# Patient Record
Sex: Female | Born: 1975 | Race: Black or African American | Hispanic: No | Marital: Single | Smoking: Current some day smoker
Health system: Southern US, Community
[De-identification: ages and names within clinical notes are randomized; demographics above are authoritative.]

## PROBLEM LIST (undated history)

## (undated) DIAGNOSIS — R42 Dizziness and giddiness: Secondary | ICD-10-CM

## (undated) DIAGNOSIS — K219 Gastro-esophageal reflux disease without esophagitis: Secondary | ICD-10-CM

## (undated) DIAGNOSIS — G5603 Carpal tunnel syndrome, bilateral upper limbs: Secondary | ICD-10-CM

## (undated) DIAGNOSIS — R7303 Prediabetes: Secondary | ICD-10-CM

## (undated) DIAGNOSIS — M6530 Trigger finger, unspecified finger: Secondary | ICD-10-CM

## (undated) DIAGNOSIS — G43019 Migraine without aura, intractable, without status migrainosus: Secondary | ICD-10-CM

## (undated) HISTORY — DX: Prediabetes: R73.03

## (undated) HISTORY — PX: BREAST REDUCTION SURGERY: SHX8

## (undated) HISTORY — DX: Gastro-esophageal reflux disease without esophagitis: K21.9

## (undated) HISTORY — DX: Migraine without aura, intractable, without status migrainosus: G43.019

## (undated) HISTORY — DX: Carpal tunnel syndrome, bilateral upper limbs: G56.03

---

## 1998-02-15 ENCOUNTER — Emergency Department (HOSPITAL_COMMUNITY): Admission: EM | Admit: 1998-02-15 | Discharge: 1998-02-15 | Payer: Self-pay | Admitting: Emergency Medicine

## 1998-07-16 ENCOUNTER — Emergency Department (HOSPITAL_COMMUNITY): Admission: EM | Admit: 1998-07-16 | Discharge: 1998-07-16 | Payer: Self-pay | Admitting: Emergency Medicine

## 1998-07-17 ENCOUNTER — Encounter: Payer: Self-pay | Admitting: Obstetrics and Gynecology

## 1998-07-17 ENCOUNTER — Ambulatory Visit (HOSPITAL_COMMUNITY): Admission: RE | Admit: 1998-07-17 | Discharge: 1998-07-17 | Payer: Self-pay | Admitting: Obstetrics and Gynecology

## 1999-07-16 ENCOUNTER — Emergency Department (HOSPITAL_COMMUNITY): Admission: EM | Admit: 1999-07-16 | Discharge: 1999-07-16 | Payer: Self-pay | Admitting: *Deleted

## 1999-11-27 ENCOUNTER — Observation Stay (HOSPITAL_COMMUNITY): Admission: AD | Admit: 1999-11-27 | Discharge: 1999-11-28 | Payer: Self-pay | Admitting: *Deleted

## 1999-11-28 ENCOUNTER — Encounter: Payer: Self-pay | Admitting: Obstetrics and Gynecology

## 2000-01-19 ENCOUNTER — Encounter (INDEPENDENT_AMBULATORY_CARE_PROVIDER_SITE_OTHER): Payer: Self-pay

## 2000-01-19 ENCOUNTER — Inpatient Hospital Stay (HOSPITAL_COMMUNITY): Admission: AD | Admit: 2000-01-19 | Discharge: 2000-01-19 | Payer: Self-pay | Admitting: *Deleted

## 2000-01-19 ENCOUNTER — Inpatient Hospital Stay (HOSPITAL_COMMUNITY): Admission: AD | Admit: 2000-01-19 | Discharge: 2000-01-22 | Payer: Self-pay | Admitting: *Deleted

## 2001-04-15 ENCOUNTER — Encounter: Payer: Self-pay | Admitting: Emergency Medicine

## 2001-04-15 ENCOUNTER — Emergency Department (HOSPITAL_COMMUNITY): Admission: EM | Admit: 2001-04-15 | Discharge: 2001-04-15 | Payer: Self-pay | Admitting: Emergency Medicine

## 2001-12-14 ENCOUNTER — Other Ambulatory Visit: Admission: RE | Admit: 2001-12-14 | Discharge: 2001-12-14 | Payer: Self-pay | Admitting: Obstetrics & Gynecology

## 2001-12-29 ENCOUNTER — Ambulatory Visit (HOSPITAL_COMMUNITY): Admission: RE | Admit: 2001-12-29 | Discharge: 2001-12-29 | Payer: Self-pay | Admitting: Obstetrics & Gynecology

## 2001-12-29 ENCOUNTER — Encounter: Payer: Self-pay | Admitting: Obstetrics & Gynecology

## 2002-02-02 ENCOUNTER — Other Ambulatory Visit: Admission: RE | Admit: 2002-02-02 | Discharge: 2002-02-02 | Payer: Self-pay | Admitting: Obstetrics & Gynecology

## 2002-03-02 ENCOUNTER — Encounter: Payer: Self-pay | Admitting: Obstetrics & Gynecology

## 2002-03-02 ENCOUNTER — Ambulatory Visit (HOSPITAL_COMMUNITY): Admission: RE | Admit: 2002-03-02 | Discharge: 2002-03-02 | Payer: Self-pay | Admitting: Obstetrics & Gynecology

## 2002-04-14 ENCOUNTER — Emergency Department (HOSPITAL_COMMUNITY): Admission: EM | Admit: 2002-04-14 | Discharge: 2002-04-14 | Payer: Self-pay | Admitting: Emergency Medicine

## 2002-05-30 ENCOUNTER — Ambulatory Visit (HOSPITAL_COMMUNITY): Admission: RE | Admit: 2002-05-30 | Discharge: 2002-05-30 | Payer: Self-pay | Admitting: *Deleted

## 2002-05-30 ENCOUNTER — Encounter: Payer: Self-pay | Admitting: Obstetrics & Gynecology

## 2002-06-28 ENCOUNTER — Inpatient Hospital Stay (HOSPITAL_COMMUNITY): Admission: AD | Admit: 2002-06-28 | Discharge: 2002-06-28 | Payer: Self-pay | Admitting: Family Medicine

## 2002-06-30 ENCOUNTER — Inpatient Hospital Stay (HOSPITAL_COMMUNITY): Admission: AD | Admit: 2002-06-30 | Discharge: 2002-07-03 | Payer: Self-pay | Admitting: Obstetrics

## 2002-12-22 ENCOUNTER — Emergency Department (HOSPITAL_COMMUNITY): Admission: EM | Admit: 2002-12-22 | Discharge: 2002-12-22 | Payer: Self-pay | Admitting: Emergency Medicine

## 2003-03-27 ENCOUNTER — Emergency Department (HOSPITAL_COMMUNITY): Admission: EM | Admit: 2003-03-27 | Discharge: 2003-03-27 | Payer: Self-pay | Admitting: Emergency Medicine

## 2007-09-17 ENCOUNTER — Emergency Department (HOSPITAL_COMMUNITY): Admission: EM | Admit: 2007-09-17 | Discharge: 2007-09-17 | Payer: Self-pay | Admitting: Family Medicine

## 2007-11-25 ENCOUNTER — Emergency Department (HOSPITAL_COMMUNITY): Admission: EM | Admit: 2007-11-25 | Discharge: 2007-11-25 | Payer: Self-pay | Admitting: Family Medicine

## 2008-06-26 ENCOUNTER — Emergency Department (HOSPITAL_COMMUNITY): Admission: EM | Admit: 2008-06-26 | Discharge: 2008-06-26 | Payer: Self-pay | Admitting: Emergency Medicine

## 2008-08-01 ENCOUNTER — Emergency Department (HOSPITAL_COMMUNITY): Admission: EM | Admit: 2008-08-01 | Discharge: 2008-08-01 | Payer: Self-pay | Admitting: Emergency Medicine

## 2009-01-13 ENCOUNTER — Emergency Department (HOSPITAL_COMMUNITY): Admission: EM | Admit: 2009-01-13 | Discharge: 2009-01-13 | Payer: Self-pay | Admitting: Emergency Medicine

## 2010-10-17 ENCOUNTER — Encounter: Payer: Self-pay | Admitting: Obstetrics & Gynecology

## 2010-10-18 ENCOUNTER — Encounter: Payer: Self-pay | Admitting: Obstetrics & Gynecology

## 2011-01-06 LAB — POCT I-STAT, CHEM 8
BUN: 7 mg/dL (ref 6–23)
Calcium, Ion: 1.16 mmol/L (ref 1.12–1.32)
Chloride: 101 mEq/L (ref 96–112)
Creatinine, Ser: 1 mg/dL (ref 0.4–1.2)
HCT: 47 % — ABNORMAL HIGH (ref 36.0–46.0)
Hemoglobin: 16 g/dL — ABNORMAL HIGH (ref 12.0–15.0)

## 2011-02-12 NOTE — H&P (Signed)
[facility]  Patient:    Patricia Boone, Patricia Boone                      MRN: [id number] Adm. Date:  [id number] Attending:  Pleas Koch Dictator:   Miguel Dibble, C.N.M.                         History and Physical  DATE OF BIRTH:                June 12, 1976.  HISTORY OF PRESENT ILLNESS:   This is a 35 year old gravida 2, para 0 at 15-weeks pregnant with prolonged prodromal labor symptoms since the night of April 23rd. It has been over 24 hours since she started contracting and over the last hour has  begun to contract more regularly, every four to five minutes.  Her cervix was 2 cm and 70% upon examination at 8 a.m. today.  It has made minimal change to 2 to 3 cm, 90% effaced, -1 station.  After discussion of options, she would like to be admitted for therapeutic rest with morphine and Phenergan.  Her pregnancy is significant for a positive group beta strep, treated twice for Trichomonas in the first and third trimesters, and sickle cell trait.  She had a complete placenta  previa that resolved by her ultrasound at 22 weeks.  PRENATAL LABORATORY DATA:     Hemoglobin 12.5, hematocrit 37.1, platelets 274,000. Blood type and Rh:  B-positive, Rh-antibodies negative.  Sickle cell trait positive.  VDRL nonreactive.  Rubella titer immune.  Hepatitis B surface antigen negative.  HIV negative.  Initial Pap smear indicates Trichomonas.  Glucose challenge test is within normal limits.  Maternal serum alpha-fetoprotein within normal limits.  Treated for UTI during this pregnancy.  ALLERGIES:                    No known drug allergies.  MEDICAL HISTORY:              Treated for gonorrhea in 1996 and for Trichomonas in 1996.  UTIs in the past.  FAMILY HISTORY:               Father with stroke.  Maternal grandfather with heart attack.  Maternal grandmother with hypertension.  GENETIC HISTORY:              Significant for sickle cell disease in  her family.  SOCIAL HISTORY:               Father of the baby is Nurse, mental health. African-American.  Single.  Patient has grade 12 education and works full-time.  Father of the baby has grade 12 education and works for ______ full-time.  Her family, mother and cousin are present with her while she is in labor, apparently giving adequate support.  Father of the baby does not appear to have continued o be involved during this pregnancy.  OBSTETRICAL HISTORY:          December of 1998, spontaneous Ab without complications.  This is her second pregnancy.  PHYSICAL EXAMINATION:  HEENT:                        Within normal limits.  LUNGS:                        Bilaterally clear.  HEART:  Regular rate and rhythm.  ABDOMEN:                      Soft and nontender.  Contractions every four to five minutes.  Mild early decelerations consistent with head compression.  PELVIC:                       Cervix is 2 to 3 cm, 90% effaced, vertex at -1.  ASSESSMENT:                   1. Prolonged prodromal labor, back labor.                               2. Positive group beta streptococcus.                               3. Sickle cell trait.                               4. History of Trichomonas infection, treated twice                                  during this pregnancy.  PLAN:                         Admit for 23-hour observation/therapeutic rest. Discussions were conducted with patient and family regarding options including discharge home with Ambien, ambulation and therapeutic rest.  Patient is unable to ambulate well.  At this point, she has been ambulating most of the day.  Did not find good relief from her Ambien that was prescribed earlier.  She will be medicated with morphine and Phenergan and reexamined when she awakens and admitted if greater-than-or-equal-to 3 cm and contracting well.  Anticipate progress in labor. DD:  01/19/00 TD:  01/20/00 Job:  [id number]

## 2011-02-12 NOTE — Discharge Summary (Signed)
NAME:  Patricia Boone, Patricia Boone                         ACCOUNT NO.:  [id number]   MEDICAL RECORD NO.:  [id number]                   PATIENT TYPE:  INP   LOCATION:  9124                                 FACILITY:  [facility]   PHYSICIAN:  Charles A. Clearance Coots, M.D.             DATE OF BIRTH:  08-03-1976   DATE OF ADMISSION:  06/30/2002  DATE OF DISCHARGE:  07/03/2002                                 DISCHARGE SUMMARY   ADMITTING DIAGNOSES:  1. A [redacted] week gestation.  2. Premature rupture of membranes.  3. Not in labor.   DISCHARGE DIAGNOSES:  1. A [redacted] week gestation.  2. Premature rupture of membranes.  3. Not in labor.  4. Status post spontaneous labor.  5. Normal spontaneous vaginal delivery of viable female infant at 10:49 on     July 01, 2002.  Apgars of 8 at one minute, 9 at five minutes.  Weight     of 2980 g.  Length of 48 cm.  There were no intrapartum complications.     The patient was discharged home in good condition with infant.   REASON FOR ADMISSION:  A 14 year old black female G3, P1-0-1-1 estimated  date of confinement of July 30, 2002 presented to [facility] triage  with complaint of spontaneous rupture of membranes at home with clear  leakage of fluid approximately three hours prior to presentation.  She was  having no perceptible uterine contractions.  Fetal movement was good.  The  patient's prenatal care at [facility] was routine.  No  complications.   PAST SURGICAL HISTORY:  Breast reduction age 89.   PAST MEDICAL HISTORY:  None.   MEDICATIONS:  Prenatal vitamins.   ALLERGIES:  No known drug allergies.   SOCIAL HISTORY:  Single.  Negative tobacco, alcohol, or recreational drug  use.   FAMILY HISTORY:  Negative for cancer.   REVIEW OF SYMPTOMS:  Per history of present illness.   PHYSICAL EXAMINATION:  GENERAL:  Well-nourished, well-developed, gravid  black female in no acute distress.  VITAL SIGNS:  Temperature 97.6, pulse 90, respiratory rate  20, blood  pressure 120/67.  HEENT:  Normal.  LUNGS:  Clear to auscultation bilaterally.  CARDIOVASCULAR:  Regular rate and rhythm without murmurs, rubs, or gallops.  ABDOMEN:  Gravid, soft, nontender.  PELVIC:  Cervical examination was omitted because of premature rupture of  membranes preterm and no uterine contractions.   IMPRESSION:  1. A [redacted] week gestation.  2. Preterm premature rupture of membranes, not in labor.   PLAN:  Admit to labor and delivery for expectant management.  Group B Strep  prophylaxis started because of prematurity.   LABORATORY DATA:  Hemoglobin 9.2, hematocrit 27.7, white blood cell count  14,100, platelets 315,000.  RPR is nonreactive.   HOSPITAL COURSE:  The patient was admitted for expectant management.  She  started having uterine contractions by hospital day number one and continued  to progress in labor with uterine contractions.  She was fully dilated at  10:35 on July 01, 2002 and progressed to normal spontaneous vaginal  delivery of viable female at 10:49 on July 01, 2002.  There were no  delivery complications and the postpartum course was uncomplicated.  The  patient was discharged home on postpartum day number two in good condition.   DISCHARGE LABORATORY DATA:  Hemoglobin 8.7, hematocrit 26.3, white blood  cell count 23,500, platelets 280,000.   DISCHARGE MEDICATIONS:  Continue prenatal vitamins and iron.  Routine  written instructions were given for diet and activity.  The patient is to  follow up in six weeks at the [facility].                                               Charles A. Clearance Coots, M.D.    CAH/MEDQ  D:  07/03/2002  T:  07/03/2002  Job:  [id number]

## 2011-02-12 NOTE — H&P (Signed)
[facility]  Patient:    Patricia Boone, Patricia Boone                      MRN: [id number] Adm. Date:  [id number] Attending:  Leonard Schwartz Dictator:   Miguel Dibble, C.N.M.                         History and Physical  DATE OF BIRTH:                07-May-1976.  HISTORY:                      This is a 35 year old gravida 2, para 0-0-1-0, at  32-4/7 weeks with complaints of vaginal bleeding today.  This is her fourth episode of bleeding during this pregnancy.  She was diagnosed with complete placenta previa at 16 weeks, which was subsequently resolved by her 22-week ultrasound.  She reports having coitus two days ago and did not immediately have bleeding following coitus.  She has had coitus previously in pregnancy without postcoital bleeding as well.  She was admitted for 23-hour observation and repeat ultrasound.  ADMISSION LABORATORY DATA:    Her admission labs include a CBC which is hemoglobin 10.9, hematocrit 31.4, platelets 329,000, white count is 13.3, blood  type is B positive and prenatal screens for [facility] screen per office records are all negative.  She was treated for Trichomonas at 16 weeks and a UTI at 16 weeks.  MEDICAL HISTORY:              1. No known drug allergies.                               2. Urinary tract infections twice during the past                                  year.                               3. Treated for Trichomonas at age 36.                               4. Abnormal Pap smears in the past with two                                  colposcopies.  FAMILY HISTORY:               Father with stroke.  Maternal grandfather with heart attack.  Maternal grandmother with hypertension.  SOCIAL HISTORY:               African-American, single, is a Furniture conservator/restorer, works full-time.  Father of the baby is a Furniture conservator/restorer, works full-time. Currently involved.  Patients family and mother are present  and supportive. Denies smoking, alcohol or drug abuse.  GENETIC HISTORY:              The patients father has Sickle cell trait.  PHYSICAL EXAMINATION  HEAD, EYES, EARS, NOSE AND THROAT:  Within normal limits.  LUNGS:                        Bilaterally clear.  CARDIAC:                      Heart regular rate and rhythm.  ABDOMEN:                      Soft and nontender.  Contractions less than or =  contractions per hour, lasting 30 seconds or less.  Mild in intensity.  PELVIC:                       Cervix is closed and long by sterile speculum exam.  EXTREMITIES:                  Negative edema.  DTRs +1.  ASSESSMENT:                   This is a 32-4/7 weeks essential prima gravida, vaginal bleeding.  PLAN:                         1. GC and Chlamydia from office are pending.                               2. Ultrasound in the morning.                               3. Admission for 23-hour observation.                               4. Bedrest with bathroom privileges and routine                                  antepartum orders.                               5. Anticipate discharge on November 28, 1999, if stable. DD:  11/27/99 TD:  11/28/99 Job: [id number]

## 2011-05-02 ENCOUNTER — Inpatient Hospital Stay (INDEPENDENT_AMBULATORY_CARE_PROVIDER_SITE_OTHER)
Admission: RE | Admit: 2011-05-02 | Discharge: 2011-05-02 | Disposition: A | Discharge status: Home / Self Care | Payer: Medicaid Other | Source: Ambulatory Visit | Attending: Family Medicine | Admitting: Family Medicine

## 2011-05-02 DIAGNOSIS — B373 Candidiasis of vulva and vagina: Secondary | ICD-10-CM

## 2011-05-02 LAB — WET PREP, GENITAL
Trich, Wet Prep: NONE SEEN
Yeast Wet Prep HPF POC: NONE SEEN

## 2011-05-02 LAB — POCT URINALYSIS DIP (DEVICE)
Glucose, UA: NEGATIVE mg/dL
Ketones, ur: NEGATIVE mg/dL
Specific Gravity, Urine: >=1.03 (ref 1.005–1.030)

## 2011-05-02 LAB — POCT PREGNANCY, URINE: Preg Test, Ur: NEGATIVE

## 2012-06-04 ENCOUNTER — Encounter (HOSPITAL_COMMUNITY): Payer: Self-pay | Admitting: Emergency Medicine

## 2012-06-04 ENCOUNTER — Emergency Department (HOSPITAL_COMMUNITY)
Admission: EM | Admit: 2012-06-04 | Discharge: 2012-06-04 | Disposition: A | Discharge status: Home / Self Care | Payer: Medicaid Other | Source: Home / Self Care | Attending: Emergency Medicine | Admitting: Emergency Medicine

## 2012-06-04 DIAGNOSIS — H8110 Benign paroxysmal vertigo, unspecified ear: Secondary | ICD-10-CM

## 2012-06-04 MED ORDER — MECLIZINE HCL 25 MG PO TABS: 25.0000 mg | ORAL_TABLET | Freq: Three times a day (TID) | ORAL | Status: AC | PRN

## 2012-06-04 NOTE — ED Provider Notes (Signed)
History     CSN: [id number]  Arrival date & time 06/04/12  1003   First MD Initiated Contact with Patient 06/04/12 1011      Chief Complaint  Patient presents with  . Dizziness    dizziness wityh nausea     (Consider location/radiation/quality/duration/timing/severity/associated sxs/prior treatment) HPI Comments: Dizziness, acute onset yesterday AM. Assoc with nausea. Improved during the day and went to work. This AM sx's returned with nausea and vomited once. Denies focal neuro sx's such as unilateral weakness, numbness. No headache, problems with speech, swallowing, hearing, vision or tinnitus.    History reviewed. No pertinent past medical history.  History reviewed. No pertinent past surgical history.  Family History  Problem Relation Age of Onset  . Diabetes Other   . Hypertension Other     History  Substance Use Topics  . Smoking status: Never Smoker   . Smokeless tobacco: Not on file  . Alcohol Use: No    OB History    Grav Para Term Preterm Abortions TAB SAB Ect Mult Living                  Review of Systems  Constitutional: Positive for activity change. Negative for fever, chills and fatigue.  HENT: Negative for ear pain, nosebleeds, congestion, rhinorrhea, neck pain, neck stiffness, postnasal drip, tinnitus and ear discharge.   Eyes: Negative for photophobia and visual disturbance.  Respiratory: Negative.   Cardiovascular: Negative.   Gastrointestinal: Positive for nausea and vomiting. Negative for abdominal pain.  Musculoskeletal: Negative.   Skin: Negative.   Neurological: Positive for light-headedness. Negative for tremors, seizures, syncope, facial asymmetry, speech difficulty, weakness, numbness and headaches.  Psychiatric/Behavioral: Negative.     Allergies  Review of patient's allergies indicates no known allergies.  Home Medications   Current Outpatient Rx  Name Route Sig Dispense Refill  . IBUPROFEN 200 MG PO TABS Oral Take 200 mg by  mouth every 6 (six) hours as needed.    Marland Kitchen MECLIZINE HCL 25 MG PO TABS Oral Take 1 tablet (25 mg total) by mouth 3 (three) times daily as needed. 30 tablet 0    BP 118/81  Pulse 79  Temp 98 F (36.7 C) (Oral)  Resp 19  SpO2 99%  Physical Exam  Constitutional: She is oriented to person, place, and time. She appears well-developed and well-nourished.  HENT:  Right Ear: External ear normal.  Left Ear: External ear normal.  Mouth/Throat: Oropharynx is clear and moist.  Eyes: Conjunctivae and EOM are normal. Pupils are equal, round, and reactive to light.       No nystagmus in sitting or supine positions  Neck: Normal range of motion. Neck supple.  Cardiovascular: Normal rate and normal heart sounds.   Pulmonary/Chest: Effort normal and breath sounds normal.  Musculoskeletal: Normal range of motion. She exhibits no edema and no tenderness.  Neurological: She is alert and oriented to person, place, and time. She displays normal reflexes. No cranial nerve deficit. Coordination normal.       Having her change positions, sitting, lying supine, rotating head elicits sx's of dizziness.   Skin: Skin is warm and dry.  Psychiatric: She has a normal mood and affect.    ED Course  Procedures (including critical care time)  Labs Reviewed - No data to display No results found.   1. Benign paroxysmal positional vertigo       MDM  Limit movement and move slowly, Rest. Limit or dont drive if dizzy.  Antivert  25 tid prn dizziness.  Plenty of fluids and stay well hydrated.  If worse, more vomiting, sweating, or other sx' such as headache, weakness or numbness go to the ED        Hayden Rasmussen, NP 06/04/12 1119  Hayden Rasmussen, NP 06/04/12 1120

## 2012-06-04 NOTE — ED Notes (Signed)
Pt c/o being dizzy and nauseas since yesterday. Vomited once this a.m. Denies any other symptoms.

## 2012-06-04 NOTE — ED Provider Notes (Signed)
Medical screening examination/treatment/procedure(s) were performed by non-physician practitioner and as supervising physician I was immediately available for consultation/collaboration.  Leslee Home, M.D.   Reuben Likes, MD 06/04/12 [phone]

## 2012-07-09 ENCOUNTER — Emergency Department (HOSPITAL_COMMUNITY)
Admission: EM | Admit: 2012-07-09 | Discharge: 2012-07-09 | Disposition: A | Discharge status: Home / Self Care | Payer: Medicaid Other | Attending: Emergency Medicine | Admitting: Emergency Medicine

## 2012-07-09 ENCOUNTER — Encounter (HOSPITAL_COMMUNITY): Payer: Self-pay | Admitting: Adult Health

## 2012-07-09 DIAGNOSIS — J029 Acute pharyngitis, unspecified: Secondary | ICD-10-CM | POA: Insufficient documentation

## 2012-07-09 MED ORDER — AMOXICILLIN 500 MG PO CAPS: 500.0000 mg | ORAL_CAPSULE | Freq: Three times a day (TID) | ORAL | Status: DC

## 2012-07-09 MED ORDER — IBUPROFEN 800 MG PO TABS: 800.0000 mg | ORAL_TABLET | Freq: Three times a day (TID) | ORAL | Status: DC

## 2012-07-09 MED ORDER — KETOROLAC TROMETHAMINE 60 MG/2ML IM SOLN
60.0000 mg | Freq: Once | INTRAMUSCULAR | Status: AC
  Administered 2012-07-09: 60 mg via INTRAMUSCULAR
  Filled 2012-07-09: qty 2

## 2012-07-09 NOTE — ED Notes (Signed)
Reports one week of sore throat on the right side, no edema noted, pain has been intermittent. Pt is handling secretions. Denies fever, nausea, headaches. Throat red.

## 2012-07-09 NOTE — ED Provider Notes (Signed)
History     CSN: [id number]  Arrival date & time 07/09/12  0142   First MD Initiated Contact with Patient 07/09/12 0211      Chief Complaint  Patient presents with  . Sore Throat    (Consider location/radiation/quality/duration/timing/severity/associated sxs/prior treatment) HPI Hx per PT, sore throat for the last 5 days has h/o strep throat. No F/C, no rash, no cough or congestion, hurts to swallow. No SOB or CP, her son is home sick with N/V but no pharyngitis. No known sick contacts otherwise. MOD in severity History reviewed. No pertinent past medical history.  History reviewed. No pertinent past surgical history.  Family History  Problem Relation Age of Onset  . Diabetes Other   . Hypertension Other     History  Substance Use Topics  . Smoking status: Never Smoker   . Smokeless tobacco: Not on file  . Alcohol Use: No    OB History    Grav Para Term Preterm Abortions TAB SAB Ect Mult Living                  Review of Systems  Constitutional: Negative for fever and chills.  HENT: Positive for sore throat. Negative for drooling, mouth sores, neck pain, neck stiffness and voice change.   Eyes: Negative for pain.  Respiratory: Negative for shortness of breath.   Cardiovascular: Negative for chest pain.  Gastrointestinal: Negative for abdominal pain.  Genitourinary: Negative for dysuria.  Musculoskeletal: Negative for back pain.  Skin: Negative for rash.  Neurological: Negative for headaches.  All other systems reviewed and are negative.    Allergies  Review of patient's allergies indicates no known allergies.  Home Medications   Current Outpatient Rx  Name Route Sig Dispense Refill  . IBUPROFEN 200 MG PO TABS Oral Take 200 mg by mouth every 6 (six) hours as needed.      BP 118/75  Temp 98.4 F (36.9 C) (Oral)  Resp 18  SpO2 98%  Physical Exam  Constitutional: She is oriented to person, place, and time. She appears well-developed and  well-nourished.  HENT:  Head: Normocephalic and atraumatic.  Right Ear: External ear normal.  Left Ear: External ear normal.  Nose: Nose normal.       Mmm, uvula midline, erythematous and enlarged tonsils without exudates. No unilateral swelling  Eyes: EOM are normal. Pupils are equal, round, and reactive to light.  Neck: Neck supple.  Cardiovascular: Regular rhythm and intact distal pulses.   Pulmonary/Chest: Effort normal. No respiratory distress.  Musculoskeletal: Normal range of motion. She exhibits no edema.  Lymphadenopathy:    She has cervical adenopathy.  Neurological: She is alert and oriented to person, place, and time.  Skin: Skin is warm and dry.    ED Course  Procedures (including critical care time)  Results for orders placed during the hospital encounter of 07/09/12  RAPID STREP SCREEN      Component Value Range   Streptococcus, Group A Screen (Direct) NEGATIVE  NEGATIVE   Requesting shot, given IM toradol.   Plan d/c home. PCP and ENT referral. Pharyngitis precautions verbalized as understood.   MDM   sore throat strep neg, bacterial versus viral. VS and nursing notes reviewed.         Sunnie Nielsen, MD 07/09/12 0230

## 2012-08-08 ENCOUNTER — Encounter (HOSPITAL_COMMUNITY): Payer: Self-pay | Admitting: *Deleted

## 2012-08-08 ENCOUNTER — Emergency Department (HOSPITAL_COMMUNITY)
Admission: EM | Admit: 2012-08-08 | Discharge: 2012-08-08 | Disposition: A | Discharge status: Home / Self Care | Payer: Medicaid Other | Source: Home / Self Care | Attending: Family Medicine | Admitting: Family Medicine

## 2012-08-08 DIAGNOSIS — G5603 Carpal tunnel syndrome, bilateral upper limbs: Secondary | ICD-10-CM

## 2012-08-08 DIAGNOSIS — G5600 Carpal tunnel syndrome, unspecified upper limb: Secondary | ICD-10-CM

## 2012-08-08 HISTORY — DX: Dizziness and giddiness: R42

## 2012-08-08 MED ORDER — IBUPROFEN 600 MG PO TABS: 600.0000 mg | ORAL_TABLET | Freq: Three times a day (TID) | ORAL | Status: DC

## 2012-08-08 MED ORDER — TRAMADOL HCL 50 MG PO TABS: 50.0000 mg | ORAL_TABLET | Freq: Four times a day (QID) | ORAL | Status: DC | PRN

## 2012-08-08 NOTE — ED Notes (Signed)
Pt reports several years of intermittent bilateral arm and hand numbness and pain.  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  She tried aleve without success

## 2012-08-08 NOTE — ED Provider Notes (Signed)
History     CSN: [id number]  Arrival date & time 08/08/12  1029   First MD Initiated Contact with Patient 08/08/12 1224      Chief Complaint  Patient presents with  . Numbness    (Consider location/radiation/quality/duration/timing/severity/associated sxs/prior treatment) HPI Comments: 36 year old female with no significant past medical history. Here complaining of 4 month history of intermittent numbness and tingling sensation in both wrists and digits. 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 Denies recent falls or direct injury to her wrists. Denies dropping objects or weakness. Patient has taken Advil inconsistently without significant relief. Denies any other symptoms. No low extremity weakness tingling and numbness. No history of hypothyroidism or diabetes.   Past Medical History  Diagnosis Date  . Vertigo     History reviewed. No pertinent past surgical history.  Family History  Problem Relation Age of Onset  . Diabetes Other   . Hypertension Other     History  Substance Use Topics  . Smoking status: Never Smoker   . Smokeless tobacco: Not on file  . Alcohol Use: No    OB History    Grav Para Term Preterm Abortions TAB SAB Ect Mult Living                  Review of Systems  Constitutional: Negative for fever, chills and fatigue.  Musculoskeletal:       As per HPI  Skin: Negative for rash.  Neurological: Negative for dizziness and headaches.    Allergies  Review of patient's allergies indicates no known allergies.  Home Medications   Current Outpatient Rx  Name  Route  Sig  Dispense  Refill  . IBUPROFEN 600 MG PO TABS   Oral   Take 1 tablet (600 mg total) by mouth 3 (three) times daily.   30 tablet   0   . TRAMADOL HCL 50 MG PO TABS   Oral   Take 1 tablet (50 mg total) by mouth every 6 (six) hours as needed for pain.   20 tablet   0     BP 119/81  Pulse 80  Temp 99.1 F (37.3 C) (Oral)  Resp 16  SpO2 99%   LMP 07/31/2012  Physical Exam  Nursing note and vitals reviewed. Constitutional: She is oriented to person, place, and time. She appears well-developed and well-nourished. No distress.  HENT:  Head: Normocephalic and atraumatic.  Neck: No thyromegaly present.  Cardiovascular: Regular rhythm.   Pulmonary/Chest: Breath sounds normal.  Musculoskeletal:       Bilateral wrist: No obvious deformity, swelling or erythema. Patient able to make a fist, abduct and adduct digits with reported minimal discomfort in both hands. normal thumb opposition to other digits with no reported pain or discomfort. No focal tenderness over the dorsal carpal or metacarpal bones. Tenderness to percussion over volar wrist surface bilateral. Intact 2 point discrimination in the dorsal and palmar aspect of the hand and fingers. Positive Phalen's test at 30 seconds. Negative Finkelstain's test. bilateral Patent radial and ulnar pulses with brisk cap refill at the tip of the fingers bilaterally. Patient reported pain with hand grip. Strength impressed normal despite discomfort.    Neurological: She is alert and oriented to person, place, and time.  Skin: No rash noted.    ED Course  Procedures (including critical care time)  Labs Reviewed - No data to display No results found.   1. Carpal tunnel syndrome, bilateral  MDM  Wrist splints placed bilaterally. Patient treated with ibuprofen and tramadol. Supportive care and red flags that should prompt her return to medical attention discussed with patient and provided in writing. Orthopedics/sports medicine referral for followup as needed.        Sharin Grave, MD 08/09/12 [phone]

## 2012-08-09 ENCOUNTER — Telehealth (HOSPITAL_COMMUNITY): Payer: Self-pay | Admitting: *Deleted

## 2012-08-09 NOTE — ED Notes (Signed)
Pt. called on VM but could not understand name. Pt. wants restrictions lifted so she can go back to work.  I called her back and verified spelling of her name. Chart accessed and the restrictions of no lifting > 15 lbs and wear splint for 10 days.  She said they sent her home from work.  She said they will let her wear the splint but will lose her job if she does not work for 10 days.  I told her there were no providers here now for me to ask. I told her to call back @ 0800 and someone can ask the doctor here about it. Patricia Boone 08/09/2012

## 2012-08-10 NOTE — ED Notes (Signed)
Dr Alfonse Ras provided work note for patient.  Handed patient work note and reviewed .

## 2012-08-10 NOTE — ED Notes (Signed)
Pt call returned - states that she has received work note

## 2013-06-29 ENCOUNTER — Encounter (HOSPITAL_COMMUNITY): Payer: Self-pay | Admitting: *Deleted

## 2013-06-29 ENCOUNTER — Emergency Department (HOSPITAL_COMMUNITY): Payer: No Typology Code available for payment source

## 2013-06-29 ENCOUNTER — Emergency Department (HOSPITAL_COMMUNITY)
Admission: EM | Admit: 2013-06-29 | Discharge: 2013-06-29 | Disposition: A | Discharge status: Home / Self Care | Payer: No Typology Code available for payment source | Attending: Emergency Medicine | Admitting: Emergency Medicine

## 2013-06-29 DIAGNOSIS — Y9389 Activity, other specified: Secondary | ICD-10-CM | POA: Insufficient documentation

## 2013-06-29 DIAGNOSIS — Y92410 Unspecified street and highway as the place of occurrence of the external cause: Secondary | ICD-10-CM | POA: Insufficient documentation

## 2013-06-29 DIAGNOSIS — S8002XA Contusion of left knee, initial encounter: Secondary | ICD-10-CM

## 2013-06-29 DIAGNOSIS — S8000XA Contusion of unspecified knee, initial encounter: Secondary | ICD-10-CM | POA: Insufficient documentation

## 2013-06-29 MED ORDER — HYDROCODONE-ACETAMINOPHEN 5-325 MG PO TABS
1.0000 | ORAL_TABLET | Freq: Once | ORAL | Status: AC
  Administered 2013-06-29: 1 via ORAL
  Filled 2013-06-29: qty 1
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
## 2013-06-29 NOTE — ED Provider Notes (Signed)
Medical screening examination/treatment/procedure(s) were performed by non-physician practitioner and as supervising physician I was immediately available for consultation/collaboration.  Arley Phenix, MD 06/29/13 [phone]
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
## 2013-07-06 ENCOUNTER — Emergency Department (INDEPENDENT_AMBULATORY_CARE_PROVIDER_SITE_OTHER)
Admission: EM | Admit: 2013-07-06 | Discharge: 2013-07-06 | Disposition: A | Discharge status: Home / Self Care | Payer: Managed Care, Other (non HMO) | Source: Home / Self Care | Attending: Family Medicine | Admitting: Family Medicine

## 2013-07-06 ENCOUNTER — Encounter (HOSPITAL_COMMUNITY): Payer: Self-pay | Admitting: Emergency Medicine

## 2013-07-06 DIAGNOSIS — M25569 Pain in unspecified knee: Secondary | ICD-10-CM

## 2013-07-06 DIAGNOSIS — M25562 Pain in left knee: Secondary | ICD-10-CM
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
## 2013-07-06 NOTE — ED Notes (Signed)
Pt here for recheck of left knee. Pt as seen in ED 10/3. Was taking out of work because they did not offer light duty. Pt needs recheck to return to work full duty.

## 2013-07-06 NOTE — ED Provider Notes (Signed)
Medical screening examination/treatment/procedure(s) were performed by resident physician or non-physician practitioner and as supervising physician I was immediately available for consultation/collaboration.   Barkley Bruns MD.   Linna Hoff, MD 07/06/13 [phone]

## 2013-10-05 ENCOUNTER — Ambulatory Visit: Payer: Self-pay

## 2013-10-10 ENCOUNTER — Emergency Department (HOSPITAL_COMMUNITY)
Admission: EM | Admit: 2013-10-10 | Discharge: 2013-10-10 | Disposition: A | Discharge status: Home / Self Care | Payer: Managed Care, Other (non HMO) | Attending: Emergency Medicine | Admitting: Emergency Medicine

## 2013-10-10 ENCOUNTER — Encounter (HOSPITAL_COMMUNITY): Payer: Self-pay | Admitting: Emergency Medicine

## 2013-10-10 DIAGNOSIS — R42 Dizziness and giddiness: Secondary | ICD-10-CM | POA: Insufficient documentation

## 2013-10-10 DIAGNOSIS — M542 Cervicalgia: Secondary | ICD-10-CM | POA: Insufficient documentation

## 2013-10-10 DIAGNOSIS — Y939 Activity, unspecified: Secondary | ICD-10-CM | POA: Insufficient documentation

## 2013-10-10 MED ORDER — CYCLOBENZAPRINE HCL 10 MG PO TABS
5.0000 mg | ORAL_TABLET | Freq: Once | ORAL | Status: AC
  Administered 2013-10-10: 5 mg via ORAL
  Filled 2013-10-10: qty 1

## 2013-10-10 MED ORDER — IBUPROFEN 800 MG PO TABS: 800.0000 mg | ORAL_TABLET | Freq: Three times a day (TID) | ORAL | Status: DC

## 2013-10-10 MED ORDER — IBUPROFEN 800 MG PO TABS
800.0000 mg | ORAL_TABLET | Freq: Once | ORAL | Status: AC
  Administered 2013-10-10: 800 mg via ORAL
  Filled 2013-10-10: qty 1

## 2013-10-10 MED ORDER — CYCLOBENZAPRINE HCL 10 MG PO TABS: 10.0000 mg | ORAL_TABLET | Freq: Two times a day (BID) | ORAL | Status: DC | PRN
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
## 2013-10-11 NOTE — ED Provider Notes (Signed)
Medical screening examination/treatment/procedure(s) were performed by non-physician practitioner and as supervising physician I was immediately available for consultation/collaboration.   Jenasia Dolinar, MD 10/11/13 0600 

## 2013-10-12 ENCOUNTER — Ambulatory Visit: Payer: Self-pay

## 2013-11-12 ENCOUNTER — Ambulatory Visit: Payer: Self-pay | Admitting: Obstetrics & Gynecology

## 2013-11-12 ENCOUNTER — Ambulatory Visit (INDEPENDENT_AMBULATORY_CARE_PROVIDER_SITE_OTHER): Payer: Managed Care, Other (non HMO) | Admitting: Obstetrics & Gynecology

## 2013-11-12 ENCOUNTER — Encounter: Payer: Self-pay | Admitting: Obstetrics & Gynecology

## 2013-11-12 VITALS — BP 121/81 | HR 100 | Temp 98.3°F | Wt 179.0 lb

## 2013-11-12 DIAGNOSIS — Z01419 Encounter for gynecological examination (general) (routine) without abnormal findings: Secondary | ICD-10-CM

## 2013-11-12 DIAGNOSIS — B379 Candidiasis, unspecified: Secondary | ICD-10-CM

## 2013-11-12 DIAGNOSIS — A499 Bacterial infection, unspecified: Secondary | ICD-10-CM

## 2013-11-12 DIAGNOSIS — Z3480 Encounter for supervision of other normal pregnancy, unspecified trimester: Secondary | ICD-10-CM

## 2013-11-12 DIAGNOSIS — Z3201 Encounter for pregnancy test, result positive: Secondary | ICD-10-CM

## 2013-11-12 LAB — HEMOGLOBIN A1C
Hgb A1c MFr Bld: 6 % — ABNORMAL HIGH (ref <5.7)
Mean Plasma Glucose: 126 mg/dL — ABNORMAL HIGH (ref <117)

## 2013-11-12 MED ORDER — METRONIDAZOLE 500 MG PO TABS: 500.0000 mg | ORAL_TABLET | Freq: Two times a day (BID) | ORAL | Status: DC

## 2013-11-12 MED ORDER — TERCONAZOLE 0.4 % VA CREA: 1.0000 | TOPICAL_CREAM | Freq: Every day | VAGINAL | Status: DC

## 2013-11-12 NOTE — Progress Notes (Signed)
Subjective:     Patricia Boone is a 38 y.o. female here for a routine exam.  Current complaints: annual exam. Patient had a positive pregnancy test in office. Patient intends to continue the pregnancy. Patient states she is having a discharge with odor. Patient states she is having cramping   Personal health questionnaire reviewed: yes.   Gynecologic History Patient's last menstrual period was 10/08/2013. Contraception: none Last Pap: 08/2012 Results were: normal   Obstetric History OB History  Gravida Para Term Preterm AB SAB TAB Ectopic Multiple Living  1             # Outcome Date GA Lbr Len/2nd Weight Sex Delivery Anes PTL Lv  1 CUR                The following portions of the patient's history were reviewed and updated as appropriate: allergies, current medications, past family history, past medical history, past social history, past surgical history and problem list.  Review of Systems Pertinent items are noted in HPI.    Objective:   General:  alert     Abdomen: soft, non-tender; bowel sounds normal; no masses,  no organomegaly   Vulva:  normal  Vagina: Thin, white, malodorous discharge  Cervix:  no lesions  Corpus: normal size, contour, position, consistency, mobility, non-tender  Adnexa:  normal adnexa    Assessment:   Early pregnant Mixed vaginitis   Plan:   Orders Placed This Encounter  Procedures  . B-HCG Quant  . Hemoglobin A1c  . POCT Wet Prep [facility]([place] [place]  U/S for dating Metronidazole/Terazol Return to initiate prenatal care

## 2013-11-12 NOTE — Patient Instructions (Signed)
Prenatal Care  °WHAT IS PRENATAL CARE?  °Prenatal care means health care during your pregnancy, before your baby is born. It is very important to take care of yourself and your baby during your pregnancy by:  °· Getting early prenatal care. If you know you are pregnant, or think you might be pregnant, call your health care provider as soon as possible. Schedule a visit for a prenatal exam. °· Getting regular prenatal care. Follow your health care provider's schedule for blood and other necessary tests. Do not miss appointments. °· Doing everything you can to keep yourself and your baby healthy during your pregnancy. °· Getting complete care. Prenatal care should include evaluation of the medical, dietary, educational, psychological, and social needs of you and your significant other. The medical and genetic history of your family and the family of your baby's father should be discussed with your health care provider. °· Discussing with your health care provider: °· Prescription, over-the-counter, and herbal medicines that you take. °· Any history of substance abuse, alcohol use, smoking, and illegal drug use. °· Any history of domestic abuse and violence. °· Immunizations you have received. °· Your nutrition and diet. °· The amount of exercise you do. °· Any environmental and occupational hazards to which you are exposed. °· History of sexually transmitted infections for both you and your partner. °· Previous pregnancies you have had. °WHY IS PRENATAL CARE SO IMPORTANT?  °By regularly seeing your health care provider, you help ensure that problems can be identified early so that they can be treated as soon as possible. Other problems might be prevented. Many studies have shown that early and regular prenatal care is important for the health of mothers and their babies.  °HOW CAN I TAKE CARE OF MYSELF WHILE I AM PREGNANT?  °Here are ways to take care of yourself and your baby:  °· Start or continue taking your  multivitamin with 400 micrograms (mcg) of folic acid every day. °· Get early and regular prenatal care. It is very important to see a health care provider during your pregnancy. Your health care provider will check at each visit to make sure that you and the baby are healthy. If there are any problems, action can be taken right away to help you and the baby. °· Eat a healthy diet that includes: °· Fruits. °· Vegetables. °· Foods low in saturated fat. °· Whole grains. °· Calcium-rich foods, such as milk, yogurt, and hard cheeses. °· Drink 6 to 8 glasses of liquids a day. °· Unless your health care provider tells you not to, try to be physically active for 30 minutes, most days of the week. If you are pressed for time, you can get your activity in through 10-minute segments, three times a day. °· Do not smoke, drink alcohol, or use drugs. These can cause long-term damage to your baby. Talk with your health care provider about steps to take to stop smoking. Talk with a member of your faith community, a counselor, a trusted friend, or your health care provider if you are concerned about your alcohol or drug use. °· Ask your health care provider before taking any medicine, even over-the-counter medicines. Some medicines are not safe to take during pregnancy. °· Get plenty of rest and sleep. °· Avoid hot tubs and saunas during pregnancy. °· Do not have X-rays taken unless absolutely necessary and with the recommendation of your health care provider. A lead shield can be placed on your abdomen to protect the   baby when X-rays are taken in other parts of the body. °· Do not empty the cat litter when you are pregnant. It may contain a parasite that causes an infection called toxoplasmosis, which can cause birth defects. Also, use gloves when working in garden areas used by cats. °· Do not eat uncooked or undercooked meats or fish. °· Do not eat soft, mold-ripened cheeses (Brie, Camembert, and chevre) or soft, blue-veined  cheese (Danish blue and Roquefort). °· Stay away from toxic chemicals like: °· Insecticides. °· Solvents (some cleaners or paint thinners). °· Lead. °· Mercury. °· Sexual intercourse may continue until the end of the pregnancy, unless you have a medical problem or there is a problem with the pregnancy and your health care provider tells you not to. °· Do not wear high-heel shoes, especially during the second half of the pregnancy. You can lose your balance and fall. °· Do not take long trips, unless absolutely necessary. Be sure to see your health care provider before going on the trip. °· Do not sit in one position for more than 2 hours when on a trip. °· Take a copy of your medical records when going on a trip. Know where a hospital is located in the city you are visiting, in case of an emergency. °· Most dangerous household products will have pregnancy warnings on their labels. Ask your health care provider about products if you are unsure. °· Limit or eliminate your caffeine intake from coffee, tea, sodas, medicines, and chocolate. °· Many women continue working through pregnancy. Staying active might help you stay healthier. If you have a question about the safety or the hours you work at your particular job, talk with your health care provider. °· Get informed: °· Read books. °· Watch videos. °· Go to childbirth classes for you and your significant other. °· Talk with experienced moms. °· Ask your health care provider about childbirth education classes for you and your partner. Classes can help you and your partner prepare for the birth of your baby. °· Ask about a baby doctor (pediatrician) and methods and pain medicine for labor, delivery, and possible cesarean delivery. °HOW OFTEN SHOULD I SEE MY HEALTH CARE PROVIDER DURING PREGNANCY?  °Your health care provider will give you a schedule for your prenatal visits. You will have visits more often as you get closer to the end of your pregnancy. An average  pregnancy lasts about 40 weeks.  °A typical schedule includes visiting your health care provider:  °· About once each month during your first 6 months of pregnancy. °· Every 2 weeks during the next 2 months. °· Weekly in the last month, until the delivery date. °Your health care provider will probably want to see you more often if: °· You are older than 35 years. °· Your pregnancy is high risk because you have certain health problems or problems with the pregnancy, such as: °· Diabetes. °· High blood pressure. °· The baby is not growing on schedule, according to the dates of the pregnancy. °Your health care provider will do special tests to make sure you and the baby are not having any serious problems. °WHAT HAPPENS DURING PRENATAL VISITS?  °· At your first prenatal visit, your health care provider will do a physical exam and talk to you about your health history and the health history of your partner and your family. Your health care provider will be able to tell you what date to expect your baby to be born on. °·   Your first physical exam will include checks of your blood pressure, measurements of your height and weight, and an exam of your pelvic organs. Your health care provider will do a Pap test if you have not had one recently and will do cultures of your cervix to make sure there is no infection. °· At each prenatal visit, there will be tests of your blood, urine, blood pressure, weight, and checking the progress of the baby. °· At your later prenatal visits, your health care provider will check how you are doing and how the baby is developing. You may have a number of tests done as your pregnancy progresses. °· Ultrasound exams are often used to check on the baby's growth and health. °· You may have more urine and blood tests, as well as special tests, if needed. These may include amniocentesis to examine fluid in the pregnancy sac, stress tests to check how the baby responds to contractions, or a  biophysical profile to measure fetus well-being. Your health care provider will explain the tests and why they are necessary. °· You should discuss with your health care provider your plans to breastfeed or bottle-feed your baby. °· Each visit is also a chance for you to learn about staying healthy during pregnancy and to ask questions. °Document Released: 09/16/2003 Document Revised: 07/04/2013 Document Reviewed: 03/01/2013 °Exit[employer]® Patient Information ©2014 [employer]. ° °

## 2013-11-13 LAB — HCG, QUANTITATIVE, PREGNANCY: HCG, BETA CHAIN, QUANT, S: 42387.2 m[IU]/mL

## 2013-11-14 LAB — PAP IG AND HPV HIGH-RISK: HPV DNA HIGH RISK: NOT DETECTED

## 2013-11-28 ENCOUNTER — Encounter: Payer: Managed Care, Other (non HMO) | Admitting: Obstetrics & Gynecology

## 2014-03-07 ENCOUNTER — Ambulatory Visit: Payer: Managed Care, Other (non HMO) | Admitting: Obstetrics & Gynecology

## 2014-07-29 ENCOUNTER — Encounter: Payer: Self-pay | Admitting: Obstetrics & Gynecology

## 2014-09-23 ENCOUNTER — Encounter: Payer: Self-pay | Admitting: *Deleted

## 2014-09-24 ENCOUNTER — Encounter: Payer: Self-pay | Admitting: Obstetrics & Gynecology

## 2014-10-03 ENCOUNTER — Ambulatory Visit (INDEPENDENT_AMBULATORY_CARE_PROVIDER_SITE_OTHER): Payer: 59 | Admitting: Physician Assistant

## 2014-10-03 ENCOUNTER — Encounter: Payer: Self-pay | Admitting: Physician Assistant

## 2014-10-03 VITALS — BP 120/78 | HR 76 | Temp 97.5°F | Resp 16 | Ht 61.0 in | Wt 180.0 lb

## 2014-10-03 DIAGNOSIS — R7309 Other abnormal glucose: Secondary | ICD-10-CM

## 2014-10-03 DIAGNOSIS — R0683 Snoring: Secondary | ICD-10-CM

## 2014-10-03 DIAGNOSIS — Z23 Encounter for immunization: Secondary | ICD-10-CM

## 2014-10-03 DIAGNOSIS — E669 Obesity, unspecified: Secondary | ICD-10-CM

## 2014-10-03 DIAGNOSIS — K219 Gastro-esophageal reflux disease without esophagitis: Secondary | ICD-10-CM

## 2014-10-03 DIAGNOSIS — I10 Essential (primary) hypertension: Secondary | ICD-10-CM

## 2014-10-03 DIAGNOSIS — M7121 Synovial cyst of popliteal space [Baker], right knee: Secondary | ICD-10-CM

## 2014-10-03 DIAGNOSIS — R6889 Other general symptoms and signs: Secondary | ICD-10-CM

## 2014-10-03 DIAGNOSIS — A499 Bacterial infection, unspecified: Secondary | ICD-10-CM

## 2014-10-03 DIAGNOSIS — Z0001 Encounter for general adult medical examination with abnormal findings: Secondary | ICD-10-CM

## 2014-10-03 DIAGNOSIS — Z113 Encounter for screening for infections with a predominantly sexual mode of transmission: Secondary | ICD-10-CM

## 2014-10-03 DIAGNOSIS — E559 Vitamin D deficiency, unspecified: Secondary | ICD-10-CM

## 2014-10-03 DIAGNOSIS — R7303 Prediabetes: Secondary | ICD-10-CM

## 2014-10-03 DIAGNOSIS — B9689 Other specified bacterial agents as the cause of diseases classified elsewhere: Secondary | ICD-10-CM

## 2014-10-03 DIAGNOSIS — N760 Acute vaginitis: Secondary | ICD-10-CM

## 2014-10-03 LAB — CBC WITH DIFFERENTIAL/PLATELET
Basophils Absolute: 0.1 10*3/uL (ref 0.0–0.1)
Basophils Relative: 1 % (ref 0–1)
Eosinophils Absolute: 0.3 10*3/uL (ref 0.0–0.7)
Eosinophils Relative: 3 % (ref 0–5)
HEMATOCRIT: 42.1 % (ref 36.0–46.0)
HEMOGLOBIN: 14.4 g/dL (ref 12.0–15.0)
LYMPHS ABS: 2.9 10*3/uL (ref 0.7–4.0)
LYMPHS PCT: 34 % (ref 12–46)
MCH: 27.2 pg (ref 26.0–34.0)
MCHC: 34.2 g/dL (ref 30.0–36.0)
MCV: 79.4 fL (ref 78.0–100.0)
MONOS PCT: 6 % (ref 3–12)
MPV: 8.9 fL (ref 8.6–12.4)
Monocytes Absolute: 0.5 10*3/uL (ref 0.1–1.0)
NEUTROS ABS: 4.7 10*3/uL (ref 1.7–7.7)
NEUTROS PCT: 56 % (ref 43–77)
Platelets: 358 10*3/uL (ref 150–400)
RBC: 5.3 MIL/uL — AB (ref 3.87–5.11)
RDW: 14.4 % (ref 11.5–15.5)
WBC: 8.4 10*3/uL (ref 4.0–10.5)

## 2014-10-03 LAB — HEMOGLOBIN A1C
Hgb A1c MFr Bld: 6.2 % — ABNORMAL HIGH (ref <5.7)
Mean Plasma Glucose: 131 mg/dL — ABNORMAL HIGH (ref <117)

## 2014-10-03 MED ORDER — METRONIDAZOLE 500 MG PO TABS: ORAL_TABLET | ORAL | Status: DC

## 2014-10-03 NOTE — Patient Instructions (Addendum)
NEED TO SCHEDULE MAMMOGRAM!!  Can call Dr. Toni Arthurs to get evaluated for dental sleep appliance. # [phone]  OR you can try Dr. Reatha Armour in [facility] # [phone]  We will send notes. Call and get price quote on both.   Sleep Apnea  Sleep apnea is a sleep disorder characterized by abnormal pauses in breathing while you sleep. When your breathing pauses, the level of oxygen in your blood decreases. This causes you to move out of deep sleep and into light sleep. As a result, your quality of sleep is poor, and the system that carries your blood throughout your body (cardiovascular system) experiences stress. If sleep apnea remains untreated, the following conditions can develop:  High blood pressure (hypertension).  Coronary artery disease.  Inability to achieve or maintain an erection (impotence).  Impairment of your thought process (cognitive dysfunction). There are three types of sleep apnea:  Obstructive sleep apnea--Pauses in breathing during sleep because of a blocked airway.  Central sleep apnea--Pauses in breathing during sleep because the area of the brain that controls your breathing does not send the correct signals to the muscles that control breathing.  Mixed sleep apnea--A combination of both obstructive and central sleep apnea. RISK FACTORS The following risk factors can increase your risk of developing sleep apnea:  Being overweight.  Smoking.  Having narrow passages in your nose and throat.  Being of older age.  Being female.  Alcohol use.  Sedative and tranquilizer use.  Ethnicity. Among individuals younger than 35 years, African Americans are at increased risk of sleep apnea. SYMPTOMS   Difficulty staying asleep.  Daytime sleepiness and fatigue.  Loss of energy.  Irritability.  Loud, heavy snoring.  Morning headaches.  Trouble concentrating.  Forgetfulness.  Decreased interest in sex. DIAGNOSIS  In order to diagnose sleep  apnea, your caregiver will perform a physical examination. Your caregiver may suggest that you take a home sleep test. Your caregiver may also recommend that you spend the night in a sleep lab. In the sleep lab, several monitors record information about your heart, lungs, and brain while you sleep. Your leg and arm movements and blood oxygen level are also recorded. TREATMENT The following actions may help to resolve mild sleep apnea:  Sleeping on your side.   Using a decongestant if you have nasal congestion.   Avoiding the use of depressants, including alcohol, sedatives, and narcotics.   Losing weight and modifying your diet if you are overweight. There also are devices and treatments to help open your airway:  Oral appliances. These are custom-made mouthpieces that shift your lower jaw forward and slightly open your bite. This opens your airway.  Devices that create positive airway pressure. This positive pressure "splints" your airway open to help you breathe better during sleep. The following devices create positive airway pressure:  Continuous positive airway pressure (CPAP) device. The CPAP device creates a continuous level of air pressure with an air pump. The air is delivered to your airway through a mask while you sleep. This continuous pressure keeps your airway open.  Nasal expiratory positive airway pressure (EPAP) device. The EPAP device creates positive air pressure as you exhale. The device consists of single-use valves, which are inserted into each nostril and held in place by adhesive. The valves create very little resistance when you inhale but create much more resistance when you exhale. That increased resistance creates the positive airway pressure. This positive pressure while you exhale keeps your airway  open, making it easier to breath when you inhale again.  Bilevel positive airway pressure (BPAP) device. The BPAP device is used mainly in patients with central sleep  apnea. This device is similar to the CPAP device because it also uses an air pump to deliver continuous air pressure through a mask. However, with the BPAP machine, the pressure is set at two different levels. The pressure when you exhale is lower than the pressure when you inhale.  Surgery. Typically, surgery is only done if you cannot comply with less invasive treatments or if the less invasive treatments do not improve your condition. Surgery involves removing excess tissue in your airway to create a wider passage way. Document Released: 09/03/2002 Document Revised: 01/08/2013 Document Reviewed: 01/20/2012 [facility] Patient Information 2015 [place], [place]. This information is not intended to replace advice given to you by your health care provider. Make sure you discuss any questions you have with your health care provider.  We want weight loss that will last so you should lose 1-2 pounds a week.  THAT IS IT! Please pick THREE things a month to change. Once it is a habit check off the item. Then pick another three items off the list to become habits.  If you are already doing a habit on the list GREAT!  Cross that item off! o Don't drink your calories. Ie, alcohol, soda, fruit juice, and sweet tea.  o Drink more water. Drink a glass when you feel hungry or before each meal.  o Eat breakfast - Complex carb and protein (likeDannon light and fit yogurt, oatmeal, fruit, eggs, [place] bacon). o Measure your cereal.  Eat no more than one cup a day. (ie [place]) o Eat an apple a day. o Add a vegetable a day. o Try a new vegetable a month. o Use Pam! Stop using oil or butter to cook. o Don't finish your plate or use smaller plates. o Share your dessert. o Eat sugar free Jello for dessert or frozen grapes. o Don't eat 2-3 hours before bed. o Switch to whole wheat bread, pasta, and brown rice. o Make healthier choices when you eat out. No fries! o Pick baked chicken, NOT fried. o Don't forget to SLOW  DOWN when you eat. It is not going anywhere.  o Take the stairs. o Park far away in the parking lot o [employer] (or weights) for 10 minutes while watching TV. o Walk at work for 10 minutes during break. o Walk outside 1 time a week with your friend, kids, dog, or significant other. o Start a walking group at church. o Walk the mall as much as you can tolerate.  o Keep a food diary. o Weigh yourself daily. o Walk for 15 minutes 3 days per week. o Cook at home more often and eat out less.  If life happens and you go back to old habits, it is okay.  Just start over. You can do it!   If you experience chest pain, get short of breath, or tired during the exercise, please stop immediately and inform your doctor.      Bad carbs also include fruit juice, alcohol, and sweet tea. These are empty calories that do not signal to your brain that you are full.   Please remember the good carbs are still carbs which convert into sugar. So please measure them out no more than 1/2-1 cup of rice, oatmeal, pasta, and beans  Veggies are however free foods! Pile them on.  Not all fruit is created equal. Please see the list below, the fruit at the bottom is higher in sugars than the fruit at the top. Please avoid all dried fruits.     Take omeprazole over the counter for 2 weeks, then go to zantac 150-300 mg at night for 2 weeks, then you can stop.  Avoid alcohol, spicy foods, NSAIDS (aleve, ibuprofen) at this time. See foods below.   Food Choices for Gastroesophageal Reflux Disease When you have gastroesophageal reflux disease (GERD), the foods you eat and your eating habits are very important. Choosing the right foods can help ease the discomfort of GERD. WHAT GENERAL GUIDELINES DO I NEED TO FOLLOW?  Choose fruits, vegetables, whole grains, low-fat dairy products, and low-fat meat, fish, and poultry.  Limit fats such as oils, salad dressings, butter, nuts, and avocado.  Keep a food diary to  identify foods that cause symptoms.  Avoid foods that cause reflux. These may be different for different people.  Eat frequent small meals instead of three large meals each day.  Eat your meals slowly, in a relaxed setting.  Limit fried foods.  Cook foods using methods other than frying.  Avoid drinking alcohol.  Avoid drinking large amounts of liquids with your meals.  Avoid bending over or lying down until 2-3 hours after eating. WHAT FOODS ARE NOT RECOMMENDED? The following are some foods and drinks that may worsen your symptoms: Vegetables Tomatoes. Tomato juice. Tomato and spaghetti sauce. Chili peppers. Onion and garlic. Horseradish. Fruits Oranges, grapefruit, and lemon (fruit and juice). Meats High-fat meats, fish, and poultry. This includes hot dogs, ribs, ham, sausage, salami, and bacon. Dairy Whole milk and chocolate milk. Sour cream. Cream. Butter. Ice cream. Cream cheese.  Beverages Coffee and tea, with or without caffeine. Carbonated beverages or energy drinks. Condiments Hot sauce. Barbecue sauce.  Sweets/Desserts Chocolate and cocoa. Donuts. Peppermint and spearmint. Fats and Oils High-fat foods, including [place] fries and potato chips. Other Vinegar. Strong spices, such as black pepper, white pepper, red pepper, cayenne, curry powder, cloves, ginger, and chili powder.  Baker Cyst A Baker cyst is a sac-like structure that forms in the back of the knee. It is filled with the same fluid that is located in your knee. This fluid lubricates the bones and cartilage of the knee and allows them to move over each other more easily. CAUSES  When the knee becomes injured or inflamed, increased fluid forms in the knee. When this happens, the joint lining is pushed out behind the knee and forms the Baker cyst. This cyst may also be caused by inflammation from arthritic conditions and infections. SIGNS AND SYMPTOMS  A Baker cyst usually has no symptoms. When the cyst is  substantially enlarged:  You may feel pressure behind the knee, stiffness in the knee, or a mass in the area behind the knee.  You may develop pain, redness, and swelling in the calf. This can suggest a blood clot and requires evaluation by your health care provider. DIAGNOSIS  A Baker cyst is most often found during an ultrasound exam. This exam may have been performed for other reasons, and the cyst was found incidentally. Sometimes an MRI is used. This picks up other problems within a joint that an ultrasound exam may not. If the Baker cyst developed immediately after an injury, X-ray exams may be used to diagnose the cyst. TREATMENT  The treatment depends on the cause of the cyst. Anti-inflammatory medicines and rest often will be prescribed.  If the cyst is caused by a bacterial infection, antibiotic medicines may be prescribed.  HOME CARE INSTRUCTIONS     For the first 24 hours while you are awake, apply ice to the injured area:  Put ice in a plastic bag.  Place a towel between your skin and the bag.  Leave the ice on for 20 minutes, 2-3 times a day.  WEAR BRACE AT WORK  TAKE ALEVE WITH FOOD FOR 7-10 DAYS 1-2 pills a day  IF NOT BETTER WILL SEND TO ORTHO OR PHYSICAL THERAPY  Only take over-the-counter or prescription medicines for pain, discomfort, or fever as directed by your health care provider.  Only take antibiotic medicine as directed. Make sure to finish it even if you start to feel better. MAKE SURE YOU:   Understand these instructions.  Will watch your condition.  Will get help right away if you are not doing well or get worse. Document Released: 09/13/2005 Document Revised: 07/04/2013 Document Reviewed: 04/25/2013 [facility] Patient Information 2015 [place]. This information is not intended to replace advice given to you by your health care provider. Make sure you discuss any questions you have with your health care provider.

## 2014-10-03 NOTE — Progress Notes (Signed)
Complete Physical  Assessment and Plan: 1. Prediabetes Discussed general issues about diabetes pathophysiology and management., Educational material distributed., Suggested low cholesterol diet., Encouraged aerobic exercise., Discussed foot care., Reminded to get yearly retinal exam. - CBC with Differential - BASIC METABOLIC PANEL WITH GFR - Hepatic function panel - Hemoglobin A1c - Insulin, fasting - Urinalysis, Routine w reflex microscopic - Microalbumin / creatinine urine ratio - EKG 12-Lead - HM DIABETES FOOT EXAM  2. Gastroesophageal reflux disease, esophagitis presence not specified Get on PPI/H2 blocker, diet discussed, weight loss discussed.   3. Obesity Obesity with co morbidities- long discussion about weight loss, diet, and exercise - Lipid panel - TSH - Ambulatory referral to Sleep Studies  4. Encounter for general adult medical examination with abnormal findings - Magnesium  5. Screen for STD (sexually transmitted disease) - RPR - HIV antibody - HSV(herpes simplex vrs) 1+2 ab-IgG - GC/chlamydia probe amp, urine  6. Snoring With IRBBB on EKG - Ambulatory referral to Sleep Studies  7. Baker cyst, right RICE, NSAIDS, exercises given, if not better get xray and PT referral or ortho referral.   8. Need for tetanus booster - Tdap vaccine greater than or equal to 7yo IM  9. Vitamin D deficiency - Vit D  25 hydroxy (rtn osteoporosis monitoring)  10. BV (bacterial vaginosis) - metroNIDAZOLE (FLAGYL) 500 MG tablet; 2 pills daily for 7 days  Dispense: 14 tablet; Refill: 0   Discussed med's effects and SE's. Screening labs and tests as requested with regular follow-up as recommended.  HPI  39 y.o. female  presents for a complete physical.   Her blood pressure has been controlled at home, today their BP is BP: 120/78 mmHg She does not workout. She denies chest pain, shortness of breath, dizziness.    She has been working on diet and exercise for prediabetes,   and denies paresthesia of the feet, polydipsia, polyuria and visual disturbances. Last A1C in the office was:  Lab Results  Component Value Date   HGBA1C 6.0* 11/12/2013  Patent has had right leg pain for 2 month along lateral/osterior thigh/knee, worse with sitting for a long time and then standing/moving, constant dull ache. Tyelnol does not help, aleve helps some. Occ will "give way", denies popping, clicking.  Had normal Xray left knee 06/2013.  Not married, 2 kids, 14 and 2412. She works at [employer].  Would like STD testing, having vaginal discharge for 1 week, has history of BV.  BMI is Body mass index is 34.03 kg/(m^2)., she is working on diet and exercise. Wt Readings from Last 3 Encounters:  10/03/14 180 lb (81.647 kg)  11/12/13 179 lb (81.194 kg)    Current Medications:  No current outpatient prescriptions on file prior to visit.   No current facility-administered medications on file prior to visit.   Health Maintenance:   Tetanus: over 10 years Pneumovax: Flu vaccine: 2015  Zostavax: LMP: Jan 2nd, regular, not heavy, last 3 days.  Pap: 10/2013 neg due 2018 Dr. Christell ConstantMoore. Never had abnormal pap.  + sexually active, not using BCP, back together x 1 year with son and daughters dad after 8 years apart. [name] (girl) and [name] (son)  MGM: DUE  DEXA: Colonoscopy: EGD: CXR 2010 Last Dental Exam: None- looking Last Eye Exam: None- looking  Patient Care Team: Lucky CowboyWilliam McKeown, MD as PCP - General (Internal Medicine)  Allergies: No Known Allergies Medical History:  Past Medical History  Diagnosis Date  . Vertigo   . Prediabetes   .  GERD (gastroesophageal reflux disease)   . Obesity     BMI 34  . Carpal tunnel syndrome on both sides     Has brace, has seen Dr. Eulah Pont   Surgical History:  Past Surgical History  Procedure Laterality Date  . Breast reduction surgery Bilateral     Age 39   Family History:  Family History  Problem Relation Age of  Onset  . Heart disease Mother   . Hypertension Mother   . Diabetes Mother    Social History:  History  Substance Use Topics  . Smoking status: Never Smoker   . Smokeless tobacco: Never Used  . Alcohol Use: Yes     Comment: wine/liquor occ   Review of Systems: Review of Systems  Constitutional: Positive for malaise/fatigue. Negative for fever, chills, weight loss and diaphoresis.  HENT: Negative.   Eyes: Negative.   Respiratory: Negative.   Cardiovascular: Negative.   Gastrointestinal: Positive for heartburn. Negative for nausea, vomiting, abdominal pain, diarrhea, constipation, blood in stool and melena.  Genitourinary: Negative.        + vaginal discharge x 2 weeks  Musculoskeletal: Positive for myalgias and joint pain. Negative for back pain, falls and neck pain.  Skin: Negative.   Neurological: Negative.  Negative for weakness.       Snoring  Psychiatric/Behavioral: Negative for depression, suicidal ideas, hallucinations, memory loss and substance abuse. The patient has insomnia. The patient is not nervous/anxious.     Physical Exam: Estimated body mass index is 34.03 kg/(m^2) as calculated from the following:   Height as of this encounter:  (1.549 m).   Weight as of this encounter: 180 lb (81.647 kg). BP 120/78 mmHg  Pulse 76  Temp(Src) 97.5 F (36.4 C)  Resp 16  Ht  (1.549 m)  Wt 180 lb (81.647 kg)  BMI 34.03 kg/m2  LMP 10/08/2013 General Appearance: Well nourished, in no apparent distress.  Eyes: PERRLA, EOMs, conjunctiva no swelling or erythema, normal fundi and vessels.  Sinuses: No Frontal/maxillary tenderness  ENT/Mouth: Ext aud canals clear, normal light reflex with TMs without erythema, bulging. Good dentition. No erythema, swelling, or exudate on post pharynx. Tonsils not swollen or erythematous. Hearing normal. Crowded mouth.  Neck: Supple, thyroid normal. No bruits  Respiratory: Respiratory effort normal, BS equal bilaterally without rales,  rhonchi, wheezing or stridor.  Cardio: RRR without murmurs, rubs or gallops. Brisk peripheral pulses without edema.  Chest: symmetric, with normal excursions and percussion.  Breasts: Symmetric, with lumps mobile/tender, well healed vertical scars below nipples from reduction without nipple discharge, retractions.  Abdomen: Soft, nontender, obese no guarding, rebound, hernias, masses, or organomegaly. .  Lymphatics: Non tender without lymphadenopathy.  Genitourinary: defer Musculoskeletal: Full ROM all peripheral extremities,5/5 strength, and normal gait.  Skin: Warm, dry without rashes, lesions, ecchymosis. Several tattoos.  Neuro: Cranial nerves intact, reflexes equal bilaterally. Normal muscle tone, no cerebellar symptoms. Sensation intact.  Psych: Awake and oriented X 3, normal affect, Insight and Judgment appropriate.   EKG: WNL, IRBBB     Quentin Mulling 9:42 AM [facility]

## 2014-10-04 LAB — URINALYSIS, ROUTINE W REFLEX MICROSCOPIC
BILIRUBIN URINE: NEGATIVE
GLUCOSE, UA: NEGATIVE mg/dL
Hgb urine dipstick: NEGATIVE
Ketones, ur: NEGATIVE mg/dL
Leukocytes, UA: NEGATIVE
Nitrite: NEGATIVE
Protein, ur: NEGATIVE mg/dL
Specific Gravity, Urine: 1.013 (ref 1.005–1.030)
UROBILINOGEN UA: 0.2 mg/dL (ref 0.0–1.0)
pH: 6 (ref 5.0–8.0)

## 2014-10-04 LAB — MICROALBUMIN / CREATININE URINE RATIO
Creatinine, Urine: 196 mg/dL
Microalb Creat Ratio: 4.6 mg/g (ref 0.0–30.0)
Microalb, Ur: 0.9 mg/dL (ref <2.0)

## 2014-10-04 LAB — BASIC METABOLIC PANEL WITH GFR
BUN: 10 mg/dL (ref 6–23)
CHLORIDE: 101 meq/L (ref 96–112)
CO2: 29 mEq/L (ref 19–32)
Calcium: 9.6 mg/dL (ref 8.4–10.5)
Creat: 0.61 mg/dL (ref 0.50–1.10)
Glucose, Bld: 79 mg/dL (ref 70–99)
POTASSIUM: 4 meq/L (ref 3.5–5.3)
SODIUM: 133 meq/L — AB (ref 135–145)

## 2014-10-04 LAB — HEPATIC FUNCTION PANEL
ALT: 21 U/L (ref 0–35)
AST: 18 U/L (ref 0–37)
Albumin: 3.9 g/dL (ref 3.5–5.2)
Alkaline Phosphatase: 56 U/L (ref 39–117)
BILIRUBIN DIRECT: 0.1 mg/dL (ref 0.0–0.3)
Indirect Bilirubin: 0.4 mg/dL (ref 0.2–1.2)
Total Bilirubin: 0.5 mg/dL (ref 0.2–1.2)
Total Protein: 6.9 g/dL (ref 6.0–8.3)

## 2014-10-04 LAB — HSV(HERPES SIMPLEX VRS) I + II AB-IGG
HSV 1 Glycoprotein G Ab, IgG: 6.84 IV — ABNORMAL HIGH
HSV 2 GLYCOPROTEIN G AB, IGG: 15.1 IV — AB

## 2014-10-04 LAB — HIV ANTIBODY (ROUTINE TESTING W REFLEX): HIV: NONREACTIVE

## 2014-10-04 LAB — LIPID PANEL
CHOL/HDL RATIO: 2.8 ratio
CHOLESTEROL: 180 mg/dL (ref 0–200)
HDL: 65 mg/dL (ref >39)
LDL Cholesterol: 90 mg/dL (ref 0–99)
TRIGLYCERIDES: 127 mg/dL (ref <150)
VLDL: 25 mg/dL (ref 0–40)

## 2014-10-04 LAB — MAGNESIUM: MAGNESIUM: 2 mg/dL (ref 1.5–2.5)

## 2014-10-04 LAB — INSULIN, FASTING: INSULIN FASTING, SERUM: 14.5 u[IU]/mL (ref 2.0–19.6)

## 2014-10-04 LAB — RPR

## 2014-10-04 LAB — VITAMIN D 25 HYDROXY (VIT D DEFICIENCY, FRACTURES): Vit D, 25-Hydroxy: 10 ng/mL — ABNORMAL LOW (ref 30–100)

## 2014-10-04 LAB — TSH: TSH: 0.602 u[IU]/mL (ref 0.350–4.500)

## 2014-10-05 LAB — GC/CHLAMYDIA PROBE AMP, URINE
Chlamydia, Swab/Urine, PCR: NEGATIVE
GC Probe Amp, Urine: NEGATIVE

## 2014-10-30 ENCOUNTER — Telehealth: Payer: Self-pay

## 2014-10-30 NOTE — Telephone Encounter (Signed)
Called patient and left message on machine [phone] with results, Sleep study results " minimal obstructive sleep apnea (OSA) (AHI=1) with no significant desaturations", advised via message to call with any questions.

## 2015-01-14 ENCOUNTER — Ambulatory Visit: Payer: Self-pay | Admitting: Physician Assistant

## 2015-07-07 ENCOUNTER — Emergency Department (HOSPITAL_COMMUNITY)
Admission: EM | Admit: 2015-07-07 | Discharge: 2015-07-07 | Disposition: A | Discharge status: Home / Self Care | Payer: 59 | Attending: Emergency Medicine | Admitting: Emergency Medicine

## 2015-07-07 ENCOUNTER — Encounter (HOSPITAL_COMMUNITY): Payer: Self-pay | Admitting: Cardiology

## 2015-07-07 DIAGNOSIS — Z8719 Personal history of other diseases of the digestive system: Secondary | ICD-10-CM | POA: Diagnosis not present

## 2015-07-07 DIAGNOSIS — N390 Urinary tract infection, site not specified: Secondary | ICD-10-CM | POA: Insufficient documentation

## 2015-07-07 DIAGNOSIS — Z8669 Personal history of other diseases of the nervous system and sense organs: Secondary | ICD-10-CM | POA: Insufficient documentation

## 2015-07-07 DIAGNOSIS — Z3202 Encounter for pregnancy test, result negative: Secondary | ICD-10-CM | POA: Diagnosis not present

## 2015-07-07 DIAGNOSIS — E669 Obesity, unspecified: Secondary | ICD-10-CM | POA: Diagnosis not present

## 2015-07-07 DIAGNOSIS — R300 Dysuria: Secondary | ICD-10-CM | POA: Diagnosis present

## 2015-07-07 LAB — URINALYSIS, ROUTINE W REFLEX MICROSCOPIC
Bilirubin Urine: NEGATIVE
GLUCOSE, UA: NEGATIVE mg/dL
Ketones, ur: NEGATIVE mg/dL
Nitrite: NEGATIVE
PH: 6.5 (ref 5.0–8.0)
Protein, ur: NEGATIVE mg/dL
Specific Gravity, Urine: 1.015 (ref 1.005–1.030)
Urobilinogen, UA: 0.2 mg/dL (ref 0.0–1.0)

## 2015-07-07 LAB — URINE MICROSCOPIC-ADD ON

## 2015-07-07 LAB — PREGNANCY, URINE: PREG TEST UR: NEGATIVE

## 2015-07-07 MED ORDER — CEPHALEXIN 500 MG PO CAPS: 500.0000 mg | ORAL_CAPSULE | Freq: Two times a day (BID) | ORAL | Status: DC

## 2015-07-07 NOTE — ED Provider Notes (Signed)
CSN: [id number]     Arrival date & time 07/07/15  0848 History   First MD Initiated Contact with Patient 07/07/15 [phone]     Chief Complaint  Patient presents with  . Dysuria     (Consider location/radiation/quality/duration/timing/severity/associated sxs/prior Treatment) HPI Comments: Pt comes in with c/o pain after urinating for 2 days. Denies n/v/d, fever, or abdominal pain. Some lower back pain. Denies vaginal discharge. States feels similar to previous uti but she has not had one in a while.  The history is provided by the patient.    Past Medical History  Diagnosis Date  . Vertigo   . Prediabetes   . GERD (gastroesophageal reflux disease)   . Obesity     BMI 34  . Carpal tunnel syndrome on both sides     Has brace, has seen Dr. Eulah Pont   Past Surgical History  Procedure Laterality Date  . Breast reduction surgery Bilateral     Age 11   Family History  Problem Relation Age of Onset  . Heart disease Mother   . Hypertension Mother   . Diabetes Mother    Social History  Substance Use Topics  . Smoking status: Never Smoker   . Smokeless tobacco: Never Used  . Alcohol Use: Yes     Comment: wine/liquor occ   OB History    Gravida Para Term Preterm AB TAB SAB Ectopic Multiple Living   1              Review of Systems  All other systems reviewed and are negative.     Allergies  Review of patient's allergies indicates no known allergies.  Home Medications   Prior to Admission medications   Medication Sig Start Date End Date Taking? Authorizing Provider  metroNIDAZOLE (FLAGYL) 500 MG tablet 2 pills daily for 7 days 10/03/14   Quentin Mulling, PA-C   There were no vitals taken for this visit. Physical Exam  Constitutional: She is oriented to person, place, and time. She appears well-developed and well-nourished.  Cardiovascular: Normal rate and regular rhythm.   Pulmonary/Chest: Effort normal and breath sounds normal.  Abdominal: Soft. Bowel sounds are normal.  There is no tenderness.  Musculoskeletal: Normal range of motion.  Neurological: She is alert and oriented to person, place, and time.  Skin: Skin is warm and dry.  Psychiatric: She has a normal mood and affect.  Nursing note reviewed.   ED Course  Procedures (including critical care time) Labs Review Labs Reviewed - No data to display  Imaging Review No results found. I have personally reviewed and evaluated these images and lab results as part of my medical decision-making.   EKG Interpretation None      MDM   Final diagnoses:  UTI (lower urinary tract infection)    Pt treated for simple uti with keflex. Pt given return precautions    Teressa Lower, NP 07/07/15 1033  Azalia Bilis, MD 07/07/15 1240

## 2015-07-07 NOTE — ED Notes (Signed)
Reports dysuria that started on Saturday. States she mostly has pain at the end of her urinary stream. No vaginal discharge. Some lower back pain.

## 2015-07-07 NOTE — Discharge Instructions (Signed)

## 2015-10-09 ENCOUNTER — Encounter: Payer: Self-pay | Admitting: Physician Assistant

## 2015-10-09 ENCOUNTER — Ambulatory Visit (INDEPENDENT_AMBULATORY_CARE_PROVIDER_SITE_OTHER): Payer: 59 | Admitting: Physician Assistant

## 2015-10-09 VITALS — BP 122/80 | HR 95 | Temp 97.9°F | Resp 18 | Ht 62.5 in | Wt 176.0 lb

## 2015-10-09 DIAGNOSIS — K219 Gastro-esophageal reflux disease without esophagitis: Secondary | ICD-10-CM

## 2015-10-09 DIAGNOSIS — E559 Vitamin D deficiency, unspecified: Secondary | ICD-10-CM | POA: Diagnosis not present

## 2015-10-09 DIAGNOSIS — Z79899 Other long term (current) drug therapy: Secondary | ICD-10-CM | POA: Diagnosis not present

## 2015-10-09 DIAGNOSIS — R6889 Other general symptoms and signs: Secondary | ICD-10-CM

## 2015-10-09 DIAGNOSIS — Z0000 Encounter for general adult medical examination without abnormal findings: Secondary | ICD-10-CM

## 2015-10-09 DIAGNOSIS — Z1389 Encounter for screening for other disorder: Secondary | ICD-10-CM

## 2015-10-09 DIAGNOSIS — Z0001 Encounter for general adult medical examination with abnormal findings: Secondary | ICD-10-CM | POA: Diagnosis not present

## 2015-10-09 DIAGNOSIS — D649 Anemia, unspecified: Secondary | ICD-10-CM | POA: Diagnosis not present

## 2015-10-09 DIAGNOSIS — E669 Obesity, unspecified: Secondary | ICD-10-CM

## 2015-10-09 DIAGNOSIS — R7303 Prediabetes: Secondary | ICD-10-CM

## 2015-10-09 DIAGNOSIS — J0100 Acute maxillary sinusitis, unspecified: Secondary | ICD-10-CM

## 2015-10-09 DIAGNOSIS — Z13220 Encounter for screening for lipoid disorders: Secondary | ICD-10-CM

## 2015-10-09 LAB — CBC WITH DIFFERENTIAL/PLATELET
BASOS ABS: 0.1 10*3/uL (ref 0.0–0.1)
Basophils Relative: 1 % (ref 0–1)
EOS PCT: 5 % (ref 0–5)
Eosinophils Absolute: 0.4 10*3/uL (ref 0.0–0.7)
HEMATOCRIT: 42.7 % (ref 36.0–46.0)
Hemoglobin: 14 g/dL (ref 12.0–15.0)
LYMPHS ABS: 2.5 10*3/uL (ref 0.7–4.0)
LYMPHS PCT: 29 % (ref 12–46)
MCH: 26.8 pg (ref 26.0–34.0)
MCHC: 32.8 g/dL (ref 30.0–36.0)
MCV: 81.8 fL (ref 78.0–100.0)
MONO ABS: 0.8 10*3/uL (ref 0.1–1.0)
MPV: 9.4 fL (ref 8.6–12.4)
Monocytes Relative: 9 % (ref 3–12)
Neutro Abs: 4.9 10*3/uL (ref 1.7–7.7)
Neutrophils Relative %: 56 % (ref 43–77)
Platelets: 294 10*3/uL (ref 150–400)
RBC: 5.22 MIL/uL — ABNORMAL HIGH (ref 3.87–5.11)
RDW: 14.8 % (ref 11.5–15.5)
WBC: 8.7 10*3/uL (ref 4.0–10.5)

## 2015-10-09 LAB — HEMOGLOBIN A1C
HEMOGLOBIN A1C: 6.2 % — AB (ref <5.7)
MEAN PLASMA GLUCOSE: 131 mg/dL — AB (ref <117)

## 2015-10-09 MED ORDER — PREDNISONE 20 MG PO TABS: ORAL_TABLET | ORAL | Status: DC

## 2015-10-09 MED ORDER — FLUCONAZOLE 150 MG PO TABS: 150.0000 mg | ORAL_TABLET | Freq: Once | ORAL | Status: DC

## 2015-10-09 MED ORDER — PROMETHAZINE-CODEINE 6.25-10 MG/5ML PO SYRP: 5.0000 mL | ORAL_SOLUTION | Freq: Four times a day (QID) | ORAL | Status: DC | PRN

## 2015-10-09 MED ORDER — AZITHROMYCIN 250 MG PO TABS: ORAL_TABLET | ORAL | Status: AC

## 2015-10-09 NOTE — Patient Instructions (Addendum)
[facility]  7 a.m.-6:30 p.m., Monday 7 a.m.-5 p.m., Tuesday-Friday Schedule an appointment by calling [phone].  [facility] Schedule an appointment by calling [phone].  Encourage you to get the 3D Mammogram  The 3D Mammogram is much more specific and sensitive to pick up breast cancer. For women with fibrocystic breast or lumpy breast it can be hard to determine if it is cancer or not but the 3D mammogram is able to tell this difference which cuts back on unneeded additional tests or scary call backs.   - over 40% increase in detection of breast cancer - over 40% reduction in false positives.  - fewer call backs - reduced anxiety - improved outcomes - PEACE OF MIND  Sinusitis can be uncomfortable. People with sinusitis have congestion with yellow/green/gray discharge, sinus pain/pressure, pain around the eyes. Sinus infections almost ALWAYS stem from a viral infection and antibiotics don't work against a virus. Even when bacteria is responsible, the infections usually clear up on their own in a week or so.   PLEASE TRY TO DO OVER THE COUNTER TREATMENT AND PREDNISONE FOR 5-7 DAYS AND IF YOU ARE NOT GETTING BETTER OR GETTING WORSE THEN YOU CAN START ON AN ANTIBIOTIC GIVEN.  Can take the prednisone AT NIGHT WITH DINNER, it take 8-12 hours to start working so it will NOT affect your sleeping if you take it at night with your food!! Take two pills the first night and 1 or two pill the second night and then 1 pill the other nights.   Risk of antibiotic use: About 1 in 4 people who take antibiotics have side effects including stomach problems, dizziness, or rashes. Those problems clear up soon after stopping the drugs, but in rare cases antibiotics can cause severe allergic reaction. Over use of antibiotics also encourages the growth of bacteria that can't be controlled easily with drugs. That makes you more vunerable to antibiotic-resistant infections  and undermines the benefits of antibiotics for others.   Waste of Money: Antibiotics often aren't very expensive, but any money spent on unnecessary drugs is money down the drain.   When are antibiotics needed? Only when symptoms last longer than a week.  Start to improve but then worsen again  -It can take up to 2 weeks to feel better.   -If you do not get better in 7-10 days (Have fever, facial pain, dental pain and swelling), then please call the office and it is now appropriate to start an antibiotic.   -Please take Tylenol or Ibuprofen for pain. -Acetaminiphen 314m orally every 4-6 hours for pain.  Max: 10 per day -Ibuprofen 2068morally every 6-8 hours for pain.  Take with food to avoid ulcers.   Max 10 per day  Please pick one of the over the counter allergy medications below and take it once daily for allergies.  Claritin or loratadine cheapest but likely the weakest  Zyrtec or certizine at night because it can make you sleepy The strongest is allegra or fexafinadine  Cheapest at [employer], [employer], [employer]  -While drinking fluids, pinch and hold nose close and swallow.  This will help open up your eustachian tubes to drain the fluid behind your ear drums. -Try steam showers to open your nasal passages.   Drink lots of water to stay hydrated and to thin mucous.  Flonase/Nasonex is to help the inflammation.  Take 2 sprays in each nostril at bedtime.  Make sure you spray towards the outside of  each nostril towards the outer corner of your eye, hold nose close and tilt head back.  This will help the medication get into your sinuses.  If you do not like this medication, then use saline nasal sprays same directions as above for Flonase. Stop the medication right away if you get blurring of your vision or nose bleeds.  Sinusitis Sinusitis is redness, soreness, and inflammation of the paranasal sinuses. Paranasal sinuses are air pockets within the bones of your face (beneath the eyes, the  middle of the forehead, or above the eyes). In healthy paranasal sinuses, mucus is able to drain out, and air is able to circulate through them by way of your nose. However, when your paranasal sinuses are inflamed, mucus and air can become trapped. This can allow bacteria and other germs to grow and cause infection. Sinusitis can develop quickly and last only a short time (acute) or continue over a long period (chronic). Sinusitis that lasts for more than 12 weeks is considered chronic.  CAUSES  Causes of sinusitis include: Allergies. Structural abnormalities, such as displacement of the cartilage that separates your nostrils (deviated septum), which can decrease the air flow through your nose and sinuses and affect sinus drainage. Functional abnormalities, such as when the small hairs (cilia) that line your sinuses and help remove mucus do not work properly or are not present. SIGNS AND SYMPTOMS  Symptoms of acute and chronic sinusitis are the same. The primary symptoms are pain and pressure around the affected sinuses. Other symptoms include: Upper toothache. Earache. Headache. Bad breath. Decreased sense of smell and taste. A cough, which worsens when you are lying flat. Fatigue. Fever. Thick drainage from your nose, which often is green and may contain pus (purulent). Swelling and warmth over the affected sinuses. DIAGNOSIS  Your health care provider will perform a physical exam. During the exam, your health care provider may: Look in your nose for signs of abnormal growths in your nostrils (nasal polyps).  Tap over the affected sinus to check for signs of infection. View the inside of your sinuses (endoscopy) using an imaging device that has a light attached (endoscope). If your health care provider suspects that you have chronic sinusitis, one or more of the following tests may be recommended: Allergy tests. Nasal culture. A sample of mucus is taken from your nose, sent to a lab, and  screened for bacteria. Nasal cytology. A sample of mucus is taken from your nose and examined by your health care provider to determine if your sinusitis is related to an allergy. TREATMENT  Most cases of acute sinusitis are related to a viral infection and will resolve on their own within 10 days. Sometimes medicines are prescribed to help relieve symptoms (pain medicine, decongestants, nasal steroid sprays, or saline sprays).  However, for sinusitis related to a bacterial infection, your health care provider will prescribe antibiotic medicines. These are medicines that will help kill the bacteria causing the infection.  Rarely, sinusitis is caused by a fungal infection. In theses cases, your health care provider will prescribe antifungal medicine. For some cases of chronic sinusitis, surgery is needed. Generally, these are cases in which sinusitis recurs more than 3 times per year, despite other treatments. HOME CARE INSTRUCTIONS  Drink plenty of water. Water helps thin the mucus so your sinuses can drain more easily. Use a humidifier. Inhale steam 3 to 4 times a day (for example, sit in the bathroom with the shower running). Apply a warm, moist washcloth to  your face 3 to 4 times a day, or as directed by your health care provider. Use saline nasal sprays to help moisten and clean your sinuses. Take medicines only as directed by your health care provider. If you were prescribed either an antibiotic or antifungal medicine, finish it all even if you start to feel better. SEEK IMMEDIATE MEDICAL CARE IF: You have increasing pain or severe headaches. You have nausea, vomiting, or drowsiness. You have swelling around your face. You have vision problems. You have a stiff neck. You have difficulty breathing. MAKE SURE YOU:  Understand these instructions. Will watch your condition. Will get help right away if you are not doing well or get worse. Document Released: 09/13/2005 Document Revised:  01/28/2014 Document Reviewed: 09/28/2011 [facility] Patient Information 2015 [place], [place]. This information is not intended to replace advice given to you by your health care provider. Make sure you discuss any questions you have with your health care provider.  Preventive Care for Adults  A healthy lifestyle and preventive care can promote health and wellness. Preventive health guidelines for women include the following key practices.  A routine yearly physical is a good way to check with your health care provider about your health and preventive screening. It is a chance to share any concerns and updates on your health and to receive a thorough exam.  Visit your dentist for a routine exam and preventive care every 6 months. Brush your teeth twice a day and floss once a day. Good oral hygiene prevents tooth decay and gum disease.  The frequency of eye exams is based on your age, health, family medical history, use of contact lenses, and other factors. Follow your health care provider's recommendations for frequency of eye exams.  Eat a healthy diet. Foods like vegetables, fruits, whole grains, low-fat dairy products, and lean protein foods contain the nutrients you need without too many calories. Decrease your intake of foods high in solid fats, added sugars, and salt. Eat the right amount of calories for you.Get information about a proper diet from your health care provider, if necessary.  Regular physical exercise is one of the most important things you can do for your health. Most adults should get at least 150 minutes of moderate-intensity exercise (any activity that increases your heart rate and causes you to sweat) each week. In addition, most adults need muscle-strengthening exercises on 2 or more days a week.  Maintain a healthy weight. The body mass index (BMI) is a screening tool to identify possible weight problems. It provides an estimate of body fat based on height and weight. Your  health care provider can find your BMI and can help you achieve or maintain a healthy weight.For adults 20 years and older:  A BMI below 18.5 is considered underweight.  A BMI of 18.5 to 24.9 is normal.  A BMI of 25 to 29.9 is considered overweight.  A BMI of 30 and above is considered obese.  Maintain normal blood lipids and cholesterol levels by exercising and minimizing your intake of saturated fat. Eat a balanced diet with plenty of fruit and vegetables. Blood tests for lipids and cholesterol should begin at age 70 and be repeated every 5 years. If your lipid or cholesterol levels are high, you are over 50, or you are at high risk for heart disease, you may need your cholesterol levels checked more frequently.Ongoing high lipid and cholesterol levels should be treated with medicines if diet and exercise are not working.  If  you smoke, find out from your health care provider how to quit. If you do not use tobacco, do not start.  Lung cancer screening is recommended for adults aged 49-80 years who are at high risk for developing lung cancer because of a history of smoking. A yearly low-dose CT scan of the lungs is recommended for people who have at least a 30-pack-year history of smoking and are a current smoker or have quit within the past 15 years. A pack year of smoking is smoking an average of 1 pack of cigarettes a day for 1 year (for example: 1 pack a day for 30 years or 2 packs a day for 15 years). Yearly screening should continue until the smoker has stopped smoking for at least 15 years. Yearly screening should be stopped for people who develop a health problem that would prevent them from having lung cancer treatment.  High blood pressure causes heart disease and increases the risk of stroke. Your blood pressure should be checked at least every 1 to 2 years. Ongoing high blood pressure should be treated with medicines if weight loss and exercise do not work.  If you are 76-79 years  old, ask your health care provider if you should take aspirin to prevent strokes.  Diabetes screening involves taking a blood sample to check your fasting blood sugar level. This should be done once every 3 years, after age 90, if you are within normal weight and without risk factors for diabetes. Testing should be considered at a younger age or be carried out more frequently if you are overweight and have at least 1 risk factor for diabetes.  Breast cancer screening is essential preventive care for women. You should practice "breast self-awareness." This means understanding the normal appearance and feel of your breasts and may include breast self-examination. Any changes detected, no matter how small, should be reported to a health care provider. Women in their 24s and 30s should have a clinical breast exam (CBE) by a health care provider as part of a regular health exam every 1 to 3 years. After age 70, women should have a CBE every year. Starting at age 68, women should consider having a mammogram (breast X-ray test) every year. Women who have a family history of breast cancer should talk to their health care provider about genetic screening. Women at a high risk of breast cancer should talk to their health care providers about having an MRI and a mammogram every year.  Breast cancer gene (BRCA)-related cancer risk assessment is recommended for women who have family members with BRCA-related cancers. BRCA-related cancers include breast, ovarian, tubal, and peritoneal cancers. Having family members with these cancers may be associated with an increased risk for harmful changes (mutations) in the breast cancer genes BRCA1 and BRCA2. Results of the assessment will determine the need for genetic counseling and BRCA1 and BRCA2 testing.  Routine pelvic exams to screen for cancer are no longer recommended for nonpregnant women who are considered low risk for cancer of the pelvic organs (ovaries, uterus, and  vagina) and who do not have symptoms. Ask your health care provider if a screening pelvic exam is right for you.  If you have had past treatment for cervical cancer or a condition that could lead to cancer, you need Pap tests and screening for cancer for at least 20 years after your treatment. If Pap tests have been discontinued, your risk factors (such as having a new sexual partner) need to be reassessed  to determine if screening should be resumed. Some women have medical problems that increase the chance of getting cervical cancer. In these cases, your health care provider may recommend more frequent screening and Pap tests.  Colorectal cancer can be detected and often prevented. Most routine colorectal cancer screening begins at the age of 54 years and continues through age 45 years. However, your health care provider may recommend screening at an earlier age if you have risk factors for colon cancer. On a yearly basis, your health care provider may provide home test kits to check for hidden blood in the stool. Use of a small camera at the end of a tube, to directly examine the colon (sigmoidoscopy or colonoscopy), can detect the earliest forms of colorectal cancer. Talk to your health care provider about this at age 67, when routine screening begins. Direct exam of the colon should be repeated every 5-10 years through age 24 years, unless early forms of pre-cancerous polyps or small growths are found.  Hepatitis C blood testing is recommended for all people born from [birth year] through 1965 and any individual with known risks for hepatitis C.  Pra  Osteoporosis is a disease in which the bones lose minerals and strength with aging. This can result in serious bone fractures or breaks. The risk of osteoporosis can be identified using a bone density scan. Women ages 60 years and over and women at risk for fractures or osteoporosis should discuss screening with their health care providers. Ask your health care  provider whether you should take a calcium supplement or vitamin D to reduce the rate of osteoporosis.  Menopause can be associated with physical symptoms and risks. Hormone replacement therapy is available to decrease symptoms and risks. You should talk to your health care provider about whether hormone replacement therapy is right for you.  Use sunscreen. Apply sunscreen liberally and repeatedly throughout the day. You should seek shade when your shadow is shorter than you. Protect yourself by wearing long sleeves, pants, a wide-brimmed hat, and sunglasses year round, whenever you are outdoors.  Once a month, do a whole body skin exam, using a mirror to look at the skin on your back. Tell your health care provider of new moles, moles that have irregular borders, moles that are larger than a pencil eraser, or moles that have changed in shape or color.  Stay current with required vaccines (immunizations).  Influenza vaccine. All adults should be immunized every year.  Tetanus, diphtheria, and acellular pertussis (Td, Tdap) vaccine. Pregnant women should receive 1 dose of Tdap vaccine during each pregnancy. The dose should be obtained regardless of the length of time since the last dose. Immunization is preferred during the 27th-36th week of gestation. An adult who has not previously received Tdap or who does not know her vaccine status should receive 1 dose of Tdap. This initial dose should be followed by tetanus and diphtheria toxoids (Td) booster doses every 10 years. Adults with an unknown or incomplete history of completing a 3-dose immunization series with Td-containing vaccines should begin or complete a primary immunization series including a Tdap dose. Adults should receive a Td booster every 10 years.  Varicella vaccine. An adult without evidence of immunity to varicella should receive 2 doses or a second dose if she has previously received 1 dose. Pregnant females who do not have evidence of  immunity should receive the first dose after pregnancy. This first dose should be obtained before leaving the health care facility. The second  dose should be obtained 4-8 weeks after the first dose.  Human papillomavirus (HPV) vaccine. Females aged 13-26 years who have not received the vaccine previously should obtain the 3-dose series. The vaccine is not recommended for use in pregnant females. However, pregnancy testing is not needed before receiving a dose. If a female is found to be pregnant after receiving a dose, no treatment is needed. In that case, the remaining doses should be delayed until after the pregnancy. Immunization is recommended for any person with an immunocompromised condition through the age of 86 years if she did not get any or all doses earlier. During the 3-dose series, the second dose should be obtained 4-8 weeks after the first dose. The third dose should be obtained 24 weeks after the first dose and 16 weeks after the second dose.  Zoster vaccine. One dose is recommended for adults aged 29 years or older unless certain conditions are present.  Measles, mumps, and rubella (MMR) vaccine. Adults born before [birth year] generally are considered immune to measles and mumps. Adults born in [birth year] or later should have 1 or more doses of MMR vaccine unless there is a contraindication to the vaccine or there is laboratory evidence of immunity to each of the three diseases. A routine second dose of MMR vaccine should be obtained at least 28 days after the first dose for students attending postsecondary schools, health care workers, or international travelers. People who received inactivated measles vaccine or an unknown type of measles vaccine during 1963-1967 should receive 2 doses of MMR vaccine. People who received inactivated mumps vaccine or an unknown type of mumps vaccine before 1979 and are at high risk for mumps infection should consider immunization with 2 doses of MMR vaccine. For females  of childbearing age, rubella immunity should be determined. If there is no evidence of immunity, females who are not pregnant should be vaccinated. If there is no evidence of immunity, females who are pregnant should delay immunization until after pregnancy. Unvaccinated health care workers born before [birth year] who lack laboratory evidence of measles, mumps, or rubella immunity or laboratory confirmation of disease should consider measles and mumps immunization with 2 doses of MMR vaccine or rubella immunization with 1 dose of MMR vaccine.  Pneumococcal 13-valent conjugate (PCV13) vaccine. When indicated, a person who is uncertain of her immunization history and has no record of immunization should receive the PCV13 vaccine. An adult aged 31 years or older who has certain medical conditions and has not been previously immunized should receive 1 dose of PCV13 vaccine. This PCV13 should be followed with a dose of pneumococcal polysaccharide (PPSV23) vaccine. The PPSV23 vaccine dose should be obtained at least 8 weeks after the dose of PCV13 vaccine. An adult aged 33 years or older who has certain medical conditions and previously received 1 or more doses of PPSV23 vaccine should receive 1 dose of PCV13. The PCV13 vaccine dose should be obtained 1 or more years after the last PPSV23 vaccine dose.    Pneumococcal polysaccharide (PPSV23) vaccine. When PCV13 is also indicated, PCV13 should be obtained first. All adults aged 96 years and older should be immunized. An adult younger than age 60 years who has certain medical conditions should be immunized. Any person who resides in a nursing home or long-term care facility should be immunized. An adult smoker should be immunized. People with an immunocompromised condition and certain other conditions should receive both PCV13 and PPSV23 vaccines. People with human immunodeficiency virus (HIV) infection should be  immunized as soon as possible after diagnosis. Immunization  during chemotherapy or radiation therapy should be avoided. Routine use of PPSV23 vaccine is not recommended for American Indians, Gray Natives, or people younger than 65 years unless there are medical conditions that require PPSV23 vaccine. When indicated, people who have unknown immunization and have no record of immunization should receive PPSV23 vaccine. One-time revaccination 5 years after the first dose of PPSV23 is recommended for people aged 19-64 years who have chronic kidney failure, nephrotic syndrome, asplenia, or immunocompromised conditions. People who received 1-2 doses of PPSV23 before age 10 years should receive another dose of PPSV23 vaccine at age 35 years or later if at least 5 years have passed since the previous dose. Doses of PPSV23 are not needed for people immunized with PPSV23 at or after age 66 years.  Preventive Services / Frequency   Ages 54 to 37 years  Blood pressure check.  Lipid and cholesterol check.  Lung cancer screening. / Every year if you are aged 21-80 years and have a 30-pack-year history of smoking and currently smoke or have quit within the past 15 years. Yearly screening is stopped once you have quit smoking for at least 15 years or develop a health problem that would prevent you from having lung cancer treatment.  Clinical breast exam.** / Every year after age 52 years.  BRCA-related cancer risk assessment.** / For women who have family members with a BRCA-related cancer (breast, ovarian, tubal, or peritoneal cancers).  Mammogram.** / Every year beginning at age 58 years and continuing for as long as you are in good health. Consult with your health care provider.  Pap test.** / Every 3 years starting at age 19 years through age 75 or 39 years with a history of 3 consecutive normal Pap tests.  HPV screening.** / Every 3 years from ages 10 years through ages 80 to 36 years with a history of 3 consecutive normal Pap tests.  Fecal occult blood test  (FOBT) of stool. / Every year beginning at age 80 years and continuing until age 40 years. You may not need to do this test if you get a colonoscopy every 10 years.  Flexible sigmoidoscopy or colonoscopy.** / Every 5 years for a flexible sigmoidoscopy or every 10 years for a colonoscopy beginning at age 8 years and continuing until age 75 years.  Hepatitis C blood test.** / For all people born from [birth year] through 1967 and any individual with known risks for hepatitis C.  Skin self-exam. / Monthly.  Influenza vaccine. / Every year.  Tetanus, diphtheria, and acellular pertussis (Tdap/Td) vaccine.** / Consult your health care provider. Pregnant women should receive 1 dose of Tdap vaccine during each pregnancy. 1 dose of Td every 10 years.  Varicella vaccine.** / Consult your health care provider. Pregnant females who do not have evidence of immunity should receive the first dose after pregnancy.  Zoster vaccine.** / 1 dose for adults aged 66 years or older.  Pneumococcal 13-valent conjugate (PCV13) vaccine.** / Consult your health care provider.  Pneumococcal polysaccharide (PPSV23) vaccine.** / 1 to 2 doses if you smoke cigarettes or if you have certain conditions.  Meningococcal vaccine.** / Consult your health care provider.  Hepatitis A vaccine.** / Consult your health care provider.  Hepatitis B vaccine.** / Consult your health care provider. Screening for abdominal aortic aneurysm (AAA)  by ultrasound is recommended for people over 50 who have history of high blood pressure or who are current or former smokers.

## 2015-10-09 NOTE — Progress Notes (Signed)
Complete Physical  Assessment and Plan: 1. Prediabetes - CBC with Differential/Platelet - BASIC METABOLIC PANEL WITH GFR - Hepatic function panel - Hemoglobin A1c - Insulin, fasting  2. Obesity Obesity with co morbidities- long discussion about weight loss, diet, and exercise - TSH  3. Gastroesophageal reflux disease, esophagitis presence not specified Continue PPI/H2 blocker, diet discussed  4. Routine general medical examination at a health care facility Get MGM, suggest 3D due to scar tissue  5. Screening cholesterol level - Lipid panel  6. Screening for blood or protein in urine - Urinalysis, Routine w reflex microscopic (not at [facility]) - Microalbumin / creatinine urine ratio  7. Vitamin D deficiency - VITAMIN D 25 Hydroxy (Vit-D Deficiency, Fractures)  8. Medication management - Magnesium  9. Anemia, unspecified anemia type - Iron and TIBC - Ferritin - Vitamin B12  10. Acute maxillary sinusitis, recurrence not specified - azithromycin (ZITHROMAX) 250 MG tablet; Take 2 tablets (500 mg) on  Day 1,  followed by 1 tablet (250 mg) once daily on Days 2 through 5.  Dispense: 6 each; Refill: 1 - predniSONE (DELTASONE) 20 MG tablet; 2 tablets daily for 3 days, 1 tablet daily for 4 days.  Dispense: 10 tablet; Refill: 0 - fluconazole (DIFLUCAN) 150 MG tablet; Take 1 tablet (150 mg total) by mouth once.  Dispense: 1 tablet; Refill: 3 - promethazine-codeine (PHENERGAN WITH CODEINE) 6.25-10 MG/5ML syrup; Take 5 mLs by mouth every 6 (six) hours as needed for cough. Max: 20mL per day  Dispense: 240 mL; Refill: 0   Discussed med's effects and SE's. Screening labs and tests as requested with regular follow-up as recommended.  HPI  40 y.o. AA female  presents for a complete physical.   Her blood pressure has been controlled at home, today their BP is BP: 122/80 mmHg She does not workout. She denies chest pain, shortness of breath, dizziness.  She complains of cough, congestion,  sinus pressure, ears popping/fullness for 3 days, has been on robitussin/nyquil/dayquil without relief. Some chills but no fever, denies SOB, CP.    She has been working on diet and exercise for prediabetes,  and denies paresthesia of the feet, polydipsia, polyuria and visual disturbances. Last A1C in the office was:  Lab Results  Component Value Date   HGBA1C 6.2* 10/03/2014   Not married, 2 kids, 15 and 25. She works at [employer].  BMI is Body mass index is 31.66 kg/(m^2)., she is working on diet and exercise. Wt Readings from Last 3 Encounters:  10/09/15 176 lb (79.833 kg)  10/03/14 180 lb (81.647 kg)  11/12/13 179 lb (81.194 kg)    Current Medications:  Current Outpatient Prescriptions on File Prior to Visit  Medication Sig Dispense Refill  . cephALEXin (KEFLEX) 500 MG capsule Take 1 capsule (500 mg total) by mouth 2 (two) times daily. (Patient not taking: Reported on 10/09/2015) 14 capsule 0  . metroNIDAZOLE (FLAGYL) 500 MG tablet 2 pills daily for 7 days (Patient not taking: Reported on 10/09/2015) 14 tablet 0   No current facility-administered medications on file prior to visit.   Immunization History  Administered Date(s) Administered  . Tdap 10/03/2014    Health Maintenance:   Tetanus: 2016 Pneumovax: Prevnar 13: due Flu vaccine: 2016  Zostavax: LMP: Patient's last menstrual period was 09/26/2015. Pap: 10/2013 neg due 2018 follows OB/GYN. Never had abnormal pap.  Not sexually active at this time Patricia Boone (girl) and Patricia Boone (son)  MGM: DUE NEEDS TO SCHEDULE  DEXA: Colonoscopy: EGD: CXR  2010 Last Dental Exam: None- looking Last Eye Exam: [employer] at mall  Patient Care Team: Lucky CowboyWilliam McKeown, MD as PCP - General (Internal Medicine)  Allergies: No Known Allergies Medical History:  Past Medical History  Diagnosis Date  . Vertigo   . Prediabetes   . GERD (gastroesophageal reflux disease)   . Obesity     BMI 34  . Carpal tunnel syndrome on  both sides     Has brace, has seen Dr. Eulah PontMurphy   Surgical History:  Past Surgical History  Procedure Laterality Date  . Breast reduction surgery Bilateral     Age 40   Family History:  Family History  Problem Relation Age of Onset  . Heart disease Mother   . Hypertension Mother   . Diabetes Mother    Social History:  Social History  Substance Use Topics  . Smoking status: Never Smoker   . Smokeless tobacco: Never Used  . Alcohol Use: Yes     Comment: wine/liquor occ   Review of Systems: Review of Systems  Constitutional: Positive for malaise/fatigue. Negative for fever, chills, weight loss and diaphoresis.  HENT: Negative.   Eyes: Negative.   Respiratory: Negative.   Cardiovascular: Negative.   Gastrointestinal: Negative for heartburn, nausea, vomiting, abdominal pain, diarrhea, constipation, blood in stool and melena.  Genitourinary: Negative.   Musculoskeletal: Positive for myalgias and joint pain. Negative for back pain, falls and neck pain.  Skin: Negative.   Neurological: Negative.  Negative for weakness.  Psychiatric/Behavioral: Negative for depression, suicidal ideas, hallucinations, memory loss and substance abuse. The patient has insomnia. The patient is not nervous/anxious.     Physical Exam: Estimated body mass index is 31.66 kg/(m^2) as calculated from the following:   Height as of this encounter: 5' 2.5" (1.588 m).   Weight as of this encounter: 176 lb (79.833 kg). BP 122/80 mmHg  Pulse 95  Temp(Src) 97.9 F (36.6 C) (Temporal)  Resp 18  Ht 5' 2.5" (1.588 m)  Wt 176 lb (79.833 kg)  BMI 31.66 kg/m2  SpO2 98%  LMP 09/26/2015 General Appearance: Well nourished, in no apparent distress.  Eyes: PERRLA, EOMs, conjunctiva no swelling or erythema, normal fundi and vessels.  Sinuses: No Frontal/maxillary tenderness  ENT/Mouth: Ext aud canals clear, normal light reflex with TMs without erythema, bulging, + bilateral effusions. Good dentition. + erythema on  post pharynx without swelling, or exudate on post pharynx. Tonsils not swollen or erythematous. Hearing normal. Crowded mouth.  Neck: Supple, thyroid normal. No bruits  Respiratory: Respiratory effort normal, BS equal bilaterally without rales, rhonchi, wheezing or stridor.  Cardio: RRR without murmurs, rubs or gallops. Brisk peripheral pulses without edema.  Chest: symmetric, with normal excursions and percussion.  Breasts: Symmetric, with lumps mobile/tender, well healed vertical scars below nipples from reduction without nipple discharge, without retractions and palpable scar tissue.  Abdomen: Soft, nontender, obese no guarding, rebound, hernias, masses, or organomegaly. .  Lymphatics: Non tender without lymphadenopathy.  Genitourinary: defer OB/GYN Musculoskeletal: Full ROM all peripheral extremities,5/5 strength, and normal gait.  Skin: Warm, dry without rashes, lesions, ecchymosis. Several tattoos.  Neuro: Cranial nerves intact, reflexes equal bilaterally. Normal muscle tone, no cerebellar symptoms. Sensation intact.  Psych: Awake and oriented X 3, normal affect, Insight and Judgment appropriate.   EKG: declines    Patricia MullingAmanda Ashar Lewinski 9:25 AM [facility]

## 2015-10-10 LAB — URINALYSIS, ROUTINE W REFLEX MICROSCOPIC
Bilirubin Urine: NEGATIVE
Glucose, UA: NEGATIVE
Hgb urine dipstick: NEGATIVE
Ketones, ur: NEGATIVE
Leukocytes, UA: NEGATIVE
Nitrite: NEGATIVE
Protein, ur: NEGATIVE
Specific Gravity, Urine: 1.02 (ref 1.001–1.035)
pH: 5.5 (ref 5.0–8.0)

## 2015-10-10 LAB — BASIC METABOLIC PANEL WITH GFR
BUN: 10 mg/dL (ref 7–25)
CO2: 26 mmol/L (ref 20–31)
Calcium: 8.8 mg/dL (ref 8.6–10.2)
Chloride: 99 mmol/L (ref 98–110)
Creat: 0.7 mg/dL (ref 0.50–1.10)
GFR, Est Non African American: >89 mL/min (ref >=60)
GLUCOSE: 81 mg/dL (ref 65–99)
POTASSIUM: 4.2 mmol/L (ref 3.5–5.3)
Sodium: 135 mmol/L (ref 135–146)

## 2015-10-10 LAB — IRON AND TIBC
%SAT: 29 % (ref 11–50)
Iron: 80 ug/dL (ref 40–190)
TIBC: 278 ug/dL (ref 250–450)
UIBC: 198 ug/dL (ref 125–400)

## 2015-10-10 LAB — LIPID PANEL
Cholesterol: 165 mg/dL (ref 125–200)
HDL: 53 mg/dL (ref >=46)
LDL CALC: 82 mg/dL (ref <130)
Total CHOL/HDL Ratio: 3.1 Ratio (ref <=5.0)
Triglycerides: 151 mg/dL — ABNORMAL HIGH (ref <150)
VLDL: 30 mg/dL (ref <30)

## 2015-10-10 LAB — HEPATIC FUNCTION PANEL
ALBUMIN: 3.8 g/dL (ref 3.6–5.1)
ALK PHOS: 52 U/L (ref 33–115)
ALT: 13 U/L (ref 6–29)
AST: 14 U/L (ref 10–30)
Bilirubin, Direct: 0.1 mg/dL (ref <=0.2)
Indirect Bilirubin: 0.3 mg/dL (ref 0.2–1.2)
TOTAL PROTEIN: 6.5 g/dL (ref 6.1–8.1)
Total Bilirubin: 0.4 mg/dL (ref 0.2–1.2)

## 2015-10-10 LAB — VITAMIN D 25 HYDROXY (VIT D DEFICIENCY, FRACTURES): Vit D, 25-Hydroxy: 7 ng/mL — ABNORMAL LOW (ref 30–100)

## 2015-10-10 LAB — MAGNESIUM: MAGNESIUM: 2 mg/dL (ref 1.5–2.5)

## 2015-10-10 LAB — INSULIN, FASTING: INSULIN FASTING, SERUM: 14.7 u[IU]/mL (ref 2.0–19.6)

## 2015-10-10 LAB — FERRITIN: Ferritin: 75 ng/mL (ref 10–291)

## 2015-10-10 LAB — VITAMIN B12: VITAMIN B 12: 534 pg/mL (ref 211–911)

## 2015-10-10 LAB — MICROALBUMIN / CREATININE URINE RATIO
Creatinine, Urine: 159 mg/dL (ref 20–320)
Microalb Creat Ratio: 4 ug/mg{creat}
Microalb, Ur: 0.7 mg/dL

## 2015-10-10 LAB — TSH: TSH: 1.502 u[IU]/mL (ref 0.350–4.500)

## 2016-05-27 ENCOUNTER — Ambulatory Visit (INDEPENDENT_AMBULATORY_CARE_PROVIDER_SITE_OTHER): Payer: 59 | Admitting: Physician Assistant

## 2016-05-27 ENCOUNTER — Encounter: Payer: Self-pay | Admitting: Physician Assistant

## 2016-05-27 VITALS — BP 122/68 | HR 88 | Temp 97.5°F | Resp 14 | Ht 62.5 in | Wt 165.2 lb

## 2016-05-27 DIAGNOSIS — R202 Paresthesia of skin: Secondary | ICD-10-CM

## 2016-05-27 DIAGNOSIS — R51 Headache: Secondary | ICD-10-CM

## 2016-05-27 DIAGNOSIS — R200 Anesthesia of skin: Secondary | ICD-10-CM

## 2016-05-27 DIAGNOSIS — R519 Headache, unspecified: Secondary | ICD-10-CM

## 2016-05-27 LAB — HEPATIC FUNCTION PANEL
ALBUMIN: 3.5 g/dL — AB (ref 3.6–5.1)
ALK PHOS: 34 U/L (ref 33–115)
ALT: 9 U/L (ref 6–29)
AST: 12 U/L (ref 10–30)
BILIRUBIN TOTAL: 0.4 mg/dL (ref 0.2–1.2)
Bilirubin, Direct: 0.1 mg/dL (ref <=0.2)
Indirect Bilirubin: 0.3 mg/dL (ref 0.2–1.2)
Total Protein: 6.4 g/dL (ref 6.1–8.1)

## 2016-05-27 LAB — CBC WITH DIFFERENTIAL/PLATELET
BASOS ABS: 0 {cells}/uL (ref 0–200)
BASOS PCT: 0 %
EOS ABS: 96 {cells}/uL (ref 15–500)
Eosinophils Relative: 1 %
HEMATOCRIT: 39.7 % (ref 35.0–45.0)
HEMOGLOBIN: 13.1 g/dL (ref 11.7–15.5)
LYMPHS ABS: 2688 {cells}/uL (ref 850–3900)
Lymphocytes Relative: 28 %
MCH: 26.8 pg — AB (ref 27.0–33.0)
MCHC: 33 g/dL (ref 32.0–36.0)
MCV: 81.2 fL (ref 80.0–100.0)
MONO ABS: 480 {cells}/uL (ref 200–950)
MPV: 9.5 fL (ref 7.5–12.5)
Monocytes Relative: 5 %
NEUTROS ABS: 6336 {cells}/uL (ref 1500–7800)
Neutrophils Relative %: 66 %
PLATELETS: 363 10*3/uL (ref 140–400)
RBC: 4.89 MIL/uL (ref 3.80–5.10)
RDW: 13.9 % (ref 11.0–15.0)
WBC: 9.6 10*3/uL (ref 3.8–10.8)

## 2016-05-27 LAB — IRON AND TIBC
%SAT: 24 % (ref 11–50)
IRON: 71 ug/dL (ref 40–190)
TIBC: 295 ug/dL (ref 250–450)
UIBC: 224 ug/dL (ref 125–400)

## 2016-05-27 LAB — BASIC METABOLIC PANEL WITH GFR
BUN: 11 mg/dL (ref 7–25)
CHLORIDE: 101 mmol/L (ref 98–110)
CO2: 24 mmol/L (ref 20–31)
CREATININE: 0.81 mg/dL (ref 0.50–1.10)
Calcium: 9 mg/dL (ref 8.6–10.2)
GFR, Est African American: >89 mL/min (ref >=60)
GFR, Est Non African American: >89 mL/min (ref >=60)
GLUCOSE: 78 mg/dL (ref 65–99)
POTASSIUM: 4.1 mmol/L (ref 3.5–5.3)
Sodium: 136 mmol/L (ref 135–146)

## 2016-05-27 LAB — FERRITIN: FERRITIN: 76 ng/mL (ref 10–232)

## 2016-05-27 LAB — RHEUMATOID FACTOR

## 2016-05-27 LAB — VITAMIN B12: VITAMIN B 12: 411 pg/mL (ref 200–1100)

## 2016-05-27 LAB — TSH: TSH: 0.99 mIU/L

## 2016-05-27 MED ORDER — FLUCONAZOLE 150 MG PO TABS: 150.0000 mg | ORAL_TABLET | Freq: Every day | ORAL | 3 refills | Status: DC

## 2016-05-27 NOTE — Progress Notes (Signed)
Subjective:    Patient ID: Patricia Boone, female    DOB: 10/23/1975, 40 y.o.   MRN: [id number]  HPI 40 y.o. right handed AAF with history of CTS presents with bilateral hand and arm numbness x 2-3 months. Numbness intermittent, pins and needles feeling except right thumb burns, worse at night, worse while driving/being on the phone. States she feels she has lost some strength with opening jars/picking up mugs etc, not dropping objects.  Bilateral hands, right thumb worse, will wake her up at night. Has tried brace at night x 2 weeks without help. Has history of heel spurs several years ago, has been having bilateral arch/mid foot pain after sitting for a long time and then standing, has some numbness bilateral feet after sitting for a long time or waking first in the morning.   She has been having headaches, denies vision changes but will occ see floaters, saw eye doctor in Feb.   Blood pressure 122/68, pulse 88, temperature 97.5 F (36.4 C), resp. rate 14, height 5' 2.5" (1.588 m), weight 165 lb 3.2 oz (74.9 kg), last menstrual period 05/07/2016, SpO2 96 %, unknown if currently breastfeeding.  Medications No current outpatient prescriptions on file prior to visit.   No current facility-administered medications on file prior to visit.     Problem list She has Prediabetes; GERD (gastroesophageal reflux disease); and Obesity on her problem list.  Review of Systems  Constitutional: Negative for activity change, appetite change, chills, diaphoresis, fatigue, fever and unexpected weight change.  HENT: Negative for tinnitus and trouble swallowing.   Eyes: Positive for visual disturbance. Negative for photophobia and discharge.  Respiratory: Negative.   Cardiovascular: Negative.   Endocrine: Negative.   Genitourinary: Negative.   Musculoskeletal: Negative.  Negative for neck pain.  Neurological: Positive for weakness, numbness and headaches. Negative for dizziness, tremors, seizures,  syncope, facial asymmetry, speech difficulty and light-headedness.  Psychiatric/Behavioral: Negative.        Objective:   Physical Exam  Constitutional: She is oriented to person, place, and time. She appears well-developed and well-nourished. No distress.  HENT:  Head: Normocephalic and atraumatic.  Right Ear: External ear normal.  Left Ear: External ear normal.  Nose: Nose normal.  Mouth/Throat: Oropharynx is clear and moist. No oropharyngeal exudate.  Eyes: Conjunctivae and EOM are normal. Pupils are equal, round, and reactive to light.  Neck: Normal range of motion. Neck supple. No thyromegaly present.  Cardiovascular: Normal rate, regular rhythm and normal heart sounds.   No murmur heard. Pulmonary/Chest: Effort normal and breath sounds normal.  Abdominal: Soft. Bowel sounds are normal. There is no tenderness.  Musculoskeletal: Normal range of motion. She exhibits no tenderness.  Lymphadenopathy:    She has no cervical adenopathy.  Neurological: She is alert and oriented to person, place, and time. She displays no atrophy and no tremor. No cranial nerve deficit or sensory deficit. She exhibits normal muscle tone. Coordination and gait normal.  Mild hyperreflexia through out Decreased grip/strength right hand, mild decreased strength right leg Normal finger to nose, negative Romberg + tinel's right hand  Skin: Skin is warm and dry. No rash noted.       Assessment & Plan:  Bilateral numbness/tingling She does have some decreased grip strength right arm, continue CT brace ? Need for EMG, check labs Will refer to neuro for eval of CTS versus neck versus other etiology Normal neuro no need for imaging at this time if worsening HA, changes vision/speech, imbalance, weakness go to the  ER

## 2016-05-28 LAB — RPR

## 2016-05-28 LAB — ANA: Anti Nuclear Antibody(ANA): NEGATIVE

## 2016-05-28 LAB — HIV ANTIBODY (ROUTINE TESTING W REFLEX): HIV: NONREACTIVE

## 2016-05-28 LAB — SEDIMENTATION RATE: SED RATE: 7 mm/h (ref 0–20)

## 2016-05-28 LAB — HEPATITIS C ANTIBODY: HCV AB: NEGATIVE

## 2016-05-28 LAB — ANTI-DNA ANTIBODY, DOUBLE-STRANDED: ds DNA Ab: <1 IU/mL

## 2016-06-03 LAB — LYME ABY, WSTRN BLT IGG & IGM W/BANDS
B BURGDORFERI IGM ABS (IB): NEGATIVE
B burgdorferi IgG Abs (IB): NEGATIVE
LYME DISEASE 18 KD IGG: NONREACTIVE
LYME DISEASE 39 KD IGM: NONREACTIVE
LYME DISEASE 41 KD IGM: NONREACTIVE
LYME DISEASE 45 KD IGG: NONREACTIVE
LYME DISEASE 58 KD IGG: NONREACTIVE
LYME DISEASE 93 KD IGG: NONREACTIVE
Lyme Disease 23 kD IgG: NONREACTIVE
Lyme Disease 23 kD IgM: NONREACTIVE
Lyme Disease 28 kD IgG: NONREACTIVE
Lyme Disease 30 kD IgG: NONREACTIVE
Lyme Disease 39 kD IgG: NONREACTIVE
Lyme Disease 41 kD IgG: NONREACTIVE
Lyme Disease 66 kD IgG: NONREACTIVE

## 2016-06-22 ENCOUNTER — Encounter: Payer: Self-pay | Admitting: Neurology

## 2016-06-22 ENCOUNTER — Ambulatory Visit (INDEPENDENT_AMBULATORY_CARE_PROVIDER_SITE_OTHER): Payer: Managed Care, Other (non HMO) | Admitting: Neurology

## 2016-06-22 VITALS — BP 128/77 | HR 78 | Ht 62.5 in | Wt 162.5 lb

## 2016-06-22 DIAGNOSIS — G43019 Migraine without aura, intractable, without status migrainosus: Secondary | ICD-10-CM | POA: Diagnosis not present

## 2016-06-22 DIAGNOSIS — R202 Paresthesia of skin: Secondary | ICD-10-CM | POA: Diagnosis not present

## 2016-06-22 HISTORY — DX: Migraine without aura, intractable, without status migrainosus: G43.019

## 2016-06-22 MED ORDER — TOPIRAMATE 25 MG PO TABS: ORAL_TABLET | ORAL | 3 refills | Status: DC

## 2016-06-22 NOTE — Patient Instructions (Addendum)
 Topamax (topiramate) is a seizure medication that has an FDA approval for seizures and for migraine headache. Potential side effects of this medication include weight loss, cognitive slowing, tingling in the fingers and toes, and carbonated drinks will taste bad. If any significant side effects are noted on this drug, please contact our office.  Migraine Headache A migraine headache is an intense, throbbing pain on one or both sides of your head. A migraine can last for 30 minutes to several hours. CAUSES  The exact cause of a migraine headache is not always known. However, a migraine may be caused when nerves in the brain become irritated and release chemicals that cause inflammation. This causes pain. Certain things may also trigger migraines, such as:  Alcohol.  Smoking.  Stress.  Menstruation.  Aged cheeses.  Foods or drinks that contain nitrates, glutamate, aspartame, or tyramine.  Lack of sleep.  Chocolate.  Caffeine.  Hunger.  Physical exertion.  Fatigue.  Medicines used to treat chest pain (nitroglycerine), birth control pills, estrogen, and some blood pressure medicines. SIGNS AND SYMPTOMS  Pain on one or both sides of your head.  Pulsating or throbbing pain.  Severe pain that prevents daily activities.  Pain that is aggravated by any physical activity.  Nausea, vomiting, or both.  Dizziness.  Pain with exposure to bright lights, loud noises, or activity.  General sensitivity to bright lights, loud noises, or smells. Before you get a migraine, you may get warning signs that a migraine is coming (aura). An aura may include:  Seeing flashing lights.  Seeing bright spots, halos, or zigzag lines.  Having tunnel vision or blurred vision.  Having feelings of numbness or tingling.  Having trouble talking.  Having muscle weakness. DIAGNOSIS  A migraine headache is often diagnosed based on:  Symptoms.  Physical exam.  A CT scan or MRI of your  head. These imaging tests cannot diagnose migraines, but they can help rule out other causes of headaches. TREATMENT Medicines may be given for pain and nausea. Medicines can also be given to help prevent recurrent migraines.  HOME CARE INSTRUCTIONS  Only take over-the-counter or prescription medicines for pain or discomfort as directed by your health care provider. The use of long-term narcotics is not recommended.  Lie down in a dark, quiet room when you have a migraine.  Keep a journal to find out what may trigger your migraine headaches. For example, write down:  What you eat and drink.  How much sleep you get.  Any change to your diet or medicines.  Limit alcohol consumption.  Quit smoking if you smoke.  Get 7-9 hours of sleep, or as recommended by your health care provider.  Limit stress.  Keep lights dim if bright lights bother you and make your migraines worse. SEEK IMMEDIATE MEDICAL CARE IF:   Your migraine becomes severe.  You have a fever.  You have a stiff neck.  You have vision loss.  You have muscular weakness or loss of muscle control.  You start losing your balance or have trouble walking.  You feel faint or pass out.  You have severe symptoms that are different from your first symptoms. MAKE SURE YOU:   Understand these instructions.  Will watch your condition.  Will get help right away if you are not doing well or get worse.   This information is not intended to replace advice given to you by your health care provider. Make sure you discuss any questions you have with your   health care provider.   Document Released: 09/13/2005 Document Revised: 10/04/2014 Document Reviewed: 05/21/2013 Elsevier Interactive Patient Education 2016 [employer]vier Inc[employer]  

## 2016-06-22 NOTE — Progress Notes (Signed)
Reason for visit: Headache, paresthesias  Referring physician: Dr. Mack Guiseollier  Patricia Boone is a 40 y.o. female  History of present illness:  Patricia Boone is a 40 year old right-handed black female with a history of paresthesias and numbness in the hands that she indicates has been a problem for several years off and on. She was told in her 2020s that she may have had tendinitis or carpal tunnel syndrome, she went to the emergency room one year ago and was told she had carpal tunnel syndrome and was given wrist splints. The patient has used the splints without much benefit. The patient will wake up at night with numbness and she is now having symptoms during the daytime as well off and on. The right hand is worse than the left. The patient denies any neck pain or pain down the arms. She denies any numbness in the feet, but she may occasionally have some burning sensations in the heels. The patient also reports onset of headache problems over the last one month. She indicates that the headaches are in the temporal regions bilaterally and in the occipital areas without associated neck stiffness. She reports some photophobia and phonophobia without nausea or vomiting. She denies any particular activating factors for the headache. The headache usually is present in the morning and persists throughout the day, she takes ibuprofen for the headache. She indicates that her brother, maternal aunt, and mother have a history of migraine headache. She is sent to this office for an evaluation. The headache currently does not keep her from working. The patient is working 2 jobs, but she indicates that she is getting adequate rest.  Past Medical History:  Diagnosis Date  . Carpal tunnel syndrome on both sides    Has brace, has seen Dr. Eulah PontMurphy  . GERD (gastroesophageal reflux disease)   . Obesity    BMI 34  . Prediabetes   . Vertigo     Past Surgical History:  Procedure Laterality Date  . BREAST REDUCTION  SURGERY Bilateral    Age 40    Family History  Problem Relation Age of Onset  . Heart disease Mother   . Hypertension Mother   . Diabetes Mother     Social history:  reports that she has never smoked. She has never used smokeless tobacco. She reports that she does not drink alcohol or use drugs.  Medications:  Prior to Admission medications   Medication Sig Start Date End Date Taking? Authorizing Provider  fluconazole (DIFLUCAN) 150 MG tablet Take 1 tablet (150 mg total) by mouth daily. 05/27/16   Quentin MullingAmanda Collier, PA-C  [facility] MedicoXULANE 150-35 MCG/24HR transdermal patch  06/01/16   Historical Provider, MD     No Known Allergies  ROS:  Out of a complete 14 system review of symptoms, the patient complains only of the following symptoms, and all other reviewed systems are negative.  Numbness  Blood pressure 128/77, pulse 78, height 5' 2.5" (1.588 m), weight 162 lb 8 oz (73.7 kg), last menstrual period 05/07/2016, unknown if currently breastfeeding.  Physical Exam  General: The patient is alert and cooperative at the time of the examination.  Eyes: Pupils are equal, round, and reactive to light. Discs are flat bilaterally.  Neck: The neck is supple, no carotid bruits are noted.  Respiratory: The respiratory examination is clear.  Cardiovascular: The cardiovascular examination reveals a regular rate and rhythm, no obvious murmurs or rubs are noted.  Neuromuscular: Range of movement of the cervical spine is full.  Skin: Extremities are without significant edema.  Neurologic Exam  Mental status: The patient is alert and oriented x 3 at the time of the examination. The patient has apparent normal recent and remote memory, with an apparently normal attention span and concentration ability.  Cranial nerves: Facial symmetry is present. There is good sensation of the face to pinprick and soft touch bilaterally. The strength of the facial muscles and the muscles to head turning and shoulder  shrug are normal bilaterally. Speech is well enunciated, no aphasia or dysarthria is noted. Extraocular movements are full. Visual fields are full. The tongue is midline, and the patient has symmetric elevation of the soft palate. No obvious hearing deficits are noted.  Motor: The motor testing reveals 5 over 5 strength of all 4 extremities. Good symmetric motor tone is noted throughout.  Sensory: Sensory testing is intact to pinprick, soft touch, vibration sensation, and position sense on all 4 extremities. No evidence of extinction is noted.  Coordination: Cerebellar testing reveals good finger-nose-finger and heel-to-shin bilaterally.  Gait and station: Gait is normal. Tandem gait is normal. Romberg is negative. No drift is seen.  Reflexes: Deep tendon reflexes are symmetric and normal bilaterally. Toes are downgoing bilaterally.   Assessment/Plan:  1. Common migraine headache  2. Paresthesias, with upper extremities, rule out carpal tunnel syndrome  The patient be set up for nerve conduction studies of both arms, EMG of the right arm. She will be placed on low-dose Topamax for the headache. She will follow-up in 3 months for evaluation of the headache. She can take Advil for the headache if needed.  Marlan Palau MD 06/22/2016 8:41 AM  [facility] [address] [place], [place] [address]  Phone [phone] Fax [phone]

## 2016-06-30 ENCOUNTER — Encounter: Payer: Managed Care, Other (non HMO) | Admitting: Neurology

## 2016-07-05 ENCOUNTER — Encounter: Payer: Self-pay | Admitting: Neurology

## 2016-07-05 ENCOUNTER — Ambulatory Visit (INDEPENDENT_AMBULATORY_CARE_PROVIDER_SITE_OTHER): Payer: Self-pay | Admitting: Neurology

## 2016-07-05 ENCOUNTER — Ambulatory Visit (INDEPENDENT_AMBULATORY_CARE_PROVIDER_SITE_OTHER): Payer: Managed Care, Other (non HMO) | Admitting: Neurology

## 2016-07-05 DIAGNOSIS — G5603 Carpal tunnel syndrome, bilateral upper limbs: Secondary | ICD-10-CM | POA: Diagnosis not present

## 2016-07-05 DIAGNOSIS — R202 Paresthesia of skin: Secondary | ICD-10-CM

## 2016-07-05 HISTORY — DX: Carpal tunnel syndrome, bilateral upper limbs: G56.03

## 2016-07-05 MED ORDER — IBUPROFEN 800 MG PO TABS: 800.0000 mg | ORAL_TABLET | Freq: Three times a day (TID) | ORAL | 1 refills | Status: DC | PRN

## 2016-07-05 NOTE — Procedures (Signed)
     HISTORY:  Patricia Boone is a 40 year old patient with a history of discomfort and numbness in both hands. The patient being evaluated for possible bilateral carpal tunnel syndrome.  NERVE CONDUCTION STUDIES:  Nerve conduction studies were performed on both upper extremities. The distal motor latencies for the median nerves were prolonged bilaterally with normal motor amplitudes for these nerves bilaterally. The distal motor latencies and motor amplitudes for the ulnar nerves were normal bilaterally. The F wave latencies and nerve conduction velocities for the median and ulnar nerves were normal bilaterally. The sensory latencies for the median nerves were prolonged bilaterally, normal for the ulnar nerves bilaterally.  EMG STUDIES:  EMG study was performed on the right upper extremity:  The first dorsal interosseous muscle reveals 2 to 4 K units with full recruitment. No fibrillations or positive waves were noted. The abductor pollicis brevis muscle reveals 2 to 4 K units with full recruitment. No fibrillations or positive waves were noted. The extensor indicis proprius muscle reveals 1 to 3 K units with full recruitment. No fibrillations or positive waves were noted. The pronator teres muscle reveals 2 to 3 K units with full recruitment. No fibrillations or positive waves were noted. The biceps muscle reveals 1 to 2 K units with full recruitment. No fibrillations or positive waves were noted. The triceps muscle reveals 2 to 4 K units with full recruitment. No fibrillations or positive waves were noted. The anterior deltoid muscle reveals 2 to 3 K units with full recruitment. No fibrillations or positive waves were noted. The cervical paraspinal muscles were tested at 2 levels. No abnormalities of insertional activity were seen at either level tested. There was good relaxation.   IMPRESSION:  Nerve conduction studies done on both upper extremities shows evidence of mild bilateral  carpal tunnel syndrome. EMG evaluation of the right upper extremity was unremarkable, without evidence of an overlying cervical radiculopathy.  Marlan Palau. Keith Kinzie Wickes MD 07/05/2016 12:40 PM  [facility] [address] [place], [place] [id number]  Phone [phone] Fax [phone]

## 2016-07-05 NOTE — Progress Notes (Signed)
EMG and nerve conduction study done today shows evidence of mild bilateral carpal tunnel syndrome. The patient claims that her symptoms have not responded to wrist splints, she is unable to sleep well at night secondary to symptoms.   The patient will be referred to a hand surgeon, Dr. Merlyn LotKuzma.

## 2016-07-05 NOTE — Progress Notes (Signed)
Please refer to EMG and nerve conduction study procedure note. 

## 2016-08-11 ENCOUNTER — Ambulatory Visit (INDEPENDENT_AMBULATORY_CARE_PROVIDER_SITE_OTHER): Payer: 59 | Admitting: Physician Assistant

## 2016-08-11 ENCOUNTER — Encounter: Payer: Self-pay | Admitting: Physician Assistant

## 2016-08-11 VITALS — BP 110/70 | HR 85 | Temp 97.5°F | Resp 16 | Ht 62.5 in | Wt 165.6 lb

## 2016-08-11 DIAGNOSIS — J0100 Acute maxillary sinusitis, unspecified: Secondary | ICD-10-CM | POA: Diagnosis not present

## 2016-08-11 MED ORDER — AZITHROMYCIN 250 MG PO TABS: ORAL_TABLET | ORAL | 1 refills | Status: AC

## 2016-08-11 MED ORDER — MOMETASONE FUROATE 50 MCG/ACT NA SUSP: 2.0000 | Freq: Every day | NASAL | 2 refills | Status: DC

## 2016-08-11 MED ORDER — PROMETHAZINE-CODEINE 6.25-10 MG/5ML PO SYRP: 5.0000 mL | ORAL_SOLUTION | Freq: Four times a day (QID) | ORAL | 0 refills | Status: DC | PRN

## 2016-08-11 NOTE — Progress Notes (Signed)
   Subjective:    Patient ID: Patricia PangAdrianne Boone, female    DOB: [birth year], 40 y.o.   MRN: [id number]  HPI 40 y.o. AAF presents with sinus issues x 1 month, has had nasal congestion and sinus drainage with left nose bleed x 1 month,  Has had cough, stuffy nose. Is on meloxicam but no other medications.   Blood pressure 110/70, pulse 85, temperature 97.5 F (36.4 C), resp. rate 16, height 5' 2.5" (1.588 m), weight 165 lb 9.6 oz (75.1 kg), last menstrual period 07/27/2016, SpO2 97 %, unknown if currently breastfeeding.   Medications Current Outpatient Prescriptions on File Prior to Visit  Medication Sig  . ibuprofen (ADVIL,MOTRIN) 800 MG tablet Take 1 tablet (800 mg total) by mouth every 8 (eight) hours as needed.  Burr Medico. XULANE 150-35 MCG/24HR transdermal patch    No current facility-administered medications on file prior to visit.     Problem list She has Prediabetes; GERD (gastroesophageal reflux disease); Obesity; Paresthesia; Common migraine with intractable migraine; and Carpal tunnel syndrome, bilateral on her problem list.  Review of Systems  Constitutional: Negative for chills and diaphoresis.  HENT: Positive for congestion, postnasal drip, sinus pressure and sneezing. Negative for ear pain and sore throat.   Respiratory: Positive for cough. Negative for chest tightness, shortness of breath and wheezing.   Cardiovascular: Negative.   Gastrointestinal: Negative.   Genitourinary: Negative.   Musculoskeletal: Negative for neck pain.  Neurological: Positive for headaches.       Objective:   Physical Exam  Constitutional: She appears well-developed and well-nourished.  HENT:  Head: Normocephalic and atraumatic.  Right Ear: External ear normal.  Nose: Right sinus exhibits maxillary sinus tenderness. Right sinus exhibits no frontal sinus tenderness. Left sinus exhibits maxillary sinus tenderness. Left sinus exhibits no frontal sinus tenderness.  Eyes: Conjunctivae and EOM are normal.   Neck: Normal range of motion. Neck supple.  Cardiovascular: Normal rate, regular rhythm, normal heart sounds and intact distal pulses.   Pulmonary/Chest: Effort normal and breath sounds normal. No respiratory distress. She has no wheezes.  Abdominal: Soft. Bowel sounds are normal.  Lymphadenopathy:    She has cervical adenopathy.  Skin: Skin is warm and dry.       Assessment & Plan:  1. Acute maxillary sinusitis, recurrence not specified Saline nasal spray, allergy pill, flonase, call if not better - azithromycin (ZITHROMAX) 250 MG tablet; Take 2 tablets (500 mg) on  Day 1,  followed by 1 tablet (250 mg) once daily on Days 2 through 5.  Dispense: 6 each; Refill: 1 - mometasone (NASONEX) 50 MCG/ACT nasal spray; Place 2 sprays into the nose daily.  Dispense: 17 g; Refill: 2 - promethazine-codeine (PHENERGAN WITH CODEINE) 6.25-10 MG/5ML syrup; Take 5 mLs by mouth every 6 (six) hours as needed for cough. Max: 20mL per day  Dispense: 240 mL; Refill: 0  Future Appointments Date Time Provider Department Center  10/11/2016 9:00 AM Quentin MullingAmanda Theadore Blunck, PA-C [facility] None  10/12/2016 7:30 AM Butch PennyMegan Millikan, NP [facility] None

## 2016-08-11 NOTE — Patient Instructions (Signed)
Saline nasal spray Humidifier Please pick one of the over the counter allergy medications below and take it once daily for allergies.  Claritin or loratadine cheapest but likely the weakest  Zyrtec or certizine at night because it can make you sleepy The strongest is allegra or fexafinadine  Cheapest at [employer], [employer], [employer]   HOW TO TREAT VIRAL COUGH AND COLD SYMPTOMS:  -Symptoms usually last at least 1 week with the worst symptoms being around day 4.  - colds usually start with a sore throat and end with a cough, and the cough can take 2 weeks to get better.  -No antibiotics are needed for colds, flu, sore throats, cough, bronchitis UNLESS symptoms are longer than 7 days OR if you are getting better then get drastically worse.  -There are a lot of combination medications (Dayquil, Nyquil, Vicks 44, tyelnol cold and sinus, ETC). Please look at the ingredients on the back so that you are treating the correct symptoms and not doubling up on medications/ingredients.    Medicines you can use  Nasal congestion  - pseudoephedrine (Sudafed)- behind the counter, do not use if you have high blood pressure, medicine that have -D in them.  - phenylephrine (Sudafed PE) -Dextormethorphan + chlorpheniramine (Coridcidin HBP)- okay if you have high blood pressure -Oxymetazoline (Afrin) nasal spray- LIMIT to 3 days -Saline nasal spray -Neti pot (used distilled or bottled water)  Ear pain/congestion  -pseudoephedrine (sudafed) - Nasonex/flonase nasal spray  Fever  -Acetaminophen (Tyelnol) -Ibuprofen (Advil, motrin, aleve)  Sore Throat  -Acetaminophen (Tyelnol) -Ibuprofen (Advil, motrin, aleve) -Drink a lot of water -Gargle with salt water - Rest your voice (don't talk) -Throat sprays -Cough drops  Body Aches  -Acetaminophen (Tyelnol) -Ibuprofen (Advil, motrin, aleve)  Headache  -Acetaminophen (Tyelnol) -Ibuprofen (Advil, motrin, aleve) - Exedrin, Exedrin Migraine  Allergy symptoms  (cough, sneeze, runny nose, itchy eyes) -Claritin or loratadine cheapest but likely the weakest  -Zyrtec or certizine at night because it can make you sleepy -The strongest is allegra or fexafinadine  Cheapest at [employer], [employer], [employer]  Cough  -Dextromethorphan (Delsym)- medicine that has DM in it -Guafenesin (Mucinex/Robitussin) - cough drops - drink lots of water  Chest Congestion  -Guafenesin (Mucinex/Robitussin)  Red Itchy Eyes  - Naphcon-A  Upset Stomach  - Bland diet (nothing spicy, greasy, fried, and high acid foods like tomatoes, oranges, berries) -OKAY- cereal, bread, soup, crackers, rice -Eat smaller more frequent meals -reduce caffeine, no alcohol -Loperamide (Imodium-AD) if diarrhea -Prevacid for heart burn  General health when sick  -Hydration -wash your hands frequently -keep surfaces clean -change pillow cases and sheets often -Get fresh air but do not exercise strenuously -Vitamin D, double up on it - Vitamin C -Zinc

## 2016-08-27 ENCOUNTER — Other Ambulatory Visit: Payer: Self-pay | Admitting: Neurology

## 2016-10-07 ENCOUNTER — Other Ambulatory Visit: Payer: Self-pay | Admitting: Orthopedic Surgery

## 2016-10-08 ENCOUNTER — Emergency Department (HOSPITAL_COMMUNITY)
Admission: EM | Admit: 2016-10-08 | Discharge: 2016-10-08 | Disposition: A | Discharge status: Home / Self Care | Payer: 59 | Attending: Emergency Medicine | Admitting: Emergency Medicine

## 2016-10-08 ENCOUNTER — Encounter (HOSPITAL_COMMUNITY): Payer: Self-pay | Admitting: *Deleted

## 2016-10-08 DIAGNOSIS — L729 Follicular cyst of the skin and subcutaneous tissue, unspecified: Secondary | ICD-10-CM

## 2016-10-08 DIAGNOSIS — R220 Localized swelling, mass and lump, head: Secondary | ICD-10-CM | POA: Insufficient documentation

## 2016-10-08 DIAGNOSIS — Z79899 Other long term (current) drug therapy: Secondary | ICD-10-CM | POA: Insufficient documentation

## 2016-10-08 HISTORY — DX: Trigger finger, unspecified finger: M65.30

## 2016-10-08 MED ORDER — CEPHALEXIN 500 MG PO CAPS: 500.0000 mg | ORAL_CAPSULE | Freq: Four times a day (QID) | ORAL | 0 refills | Status: DC

## 2016-10-08 NOTE — ED Provider Notes (Signed)
[facility]EMERGENCY DEPT Provider Note    By signing my name below, I, Earmon Phoenix, attest that this documentation has been prepared under the direction and in the presence of Melburn Hake, PA-C. Electronically Signed: Earmon Phoenix, ED Scribe. 10/08/16. 9:49 AM.    History   Chief Complaint Chief Complaint  Patient presents with  . Facial Pain   The history is provided by the patient and medical records. No language interpreter was used.    Patricia Boone is a 41 y.o. female who presents to the Emergency Department complaining of a hard nodule to the left lower cheek that appeared three days ago. She reports being able to produce a small amount of bloody, purulent drainage in the beginning but now states it is only draining clear fluid. She states the area started out hard but has hardened and grown since appearing. She reports some mild tenderness to the area. SHe has has not taken anything for pain relief. Pt denies modifying factors. She denies difficulty swallowing or breathing, trismus, drooling, trauma, injury, dental pain, fever, chills, vomiting, abdominal pain. She has an appt with her PCP in three days.    Past Medical History:  Diagnosis Date  . Carpal tunnel syndrome on both sides    Has brace, has seen Dr. Eulah Pont  . Carpal tunnel syndrome, bilateral 07/05/2016  . Common migraine with intractable migraine 06/22/2016  . GERD (gastroesophageal reflux disease)   . Obesity    BMI 34  . Prediabetes   . Trigger finger   . Vertigo     Patient Active Problem List   Diagnosis Date Noted  . Carpal tunnel syndrome, bilateral 07/05/2016  . Paresthesia 06/22/2016  . Common migraine with intractable migraine 06/22/2016  . Prediabetes   . GERD (gastroesophageal reflux disease)   . Obesity     Past Surgical History:  Procedure Laterality Date  . BREAST REDUCTION SURGERY Bilateral    Age 59    OB History    Gravida Para Term Preterm AB Living   1             SAB TAB  Ectopic Multiple Live Births                   Home Medications    Prior to Admission medications   Medication Sig Start Date End Date Taking? Authorizing Provider  cephALEXin (KEFLEX) 500 MG capsule Take 1 capsule (500 mg total) by mouth 4 (four) times daily. 10/08/16   Barrett Henle, PA-C  ibuprofen (ADVIL,MOTRIN) 800 MG tablet TAKE 1 TABLET (800 MG TOTAL) BY MOUTH EVERY 8 (EIGHT) HOURS AS NEEDED. 08/27/16   York Spaniel, MD  mometasone (NASONEX) 50 MCG/ACT nasal spray Place 2 sprays into the nose daily. 08/11/16 08/11/17  Quentin Mulling, PA-C  promethazine-codeine (PHENERGAN WITH CODEINE) 6.25-10 MG/5ML syrup Take 5 mLs by mouth every 6 (six) hours as needed for cough. Max: 20mL per day 08/11/16   Quentin Mulling, PA-C  Burr Medico 150-35 MCG/24HR transdermal patch  06/01/16   Historical Provider, MD    Family History Family History  Problem Relation Age of Onset  . Heart disease Mother   . Hypertension Mother   . Diabetes Mother     Social History Social History  Substance Use Topics  . Smoking status: Never Smoker  . Smokeless tobacco: Never Used  . Alcohol use No     Allergies   Patient has no known allergies.   Review of Systems Review of Systems  Constitutional:  Negative for chills and fever.  HENT: Positive for facial swelling. Negative for dental problem, drooling and trouble swallowing.   Respiratory: Negative for shortness of breath.   Gastrointestinal: Negative for abdominal pain and vomiting.     Physical Exam Updated Vital Signs BP 113/84 (BP Location: Right Arm)   Pulse 76   Temp 98.3 F (36.8 C) (Oral)   Resp 14   Ht 5\' 1"  (1.549 m)   Wt 160 lb (72.6 kg)   LMP 09/28/2016 (Approximate)   SpO2 100%   Breastfeeding? No   BMI 30.23 kg/m   Physical Exam  Constitutional: She is oriented to person, place, and time. She appears well-developed and well-nourished.  HENT:  Head: Normocephalic and atraumatic.  Mouth/Throat: Uvula is midline,  oropharynx is clear and moist and mucous membranes are normal. No oral lesions. No trismus in the jaw. Normal dentition. No dental abscesses, uvula swelling or dental caries.  Eyes: Conjunctivae and EOM are normal. Right eye exhibits no discharge. Left eye exhibits no discharge. No scleral icterus.  Cardiovascular: Normal rate.   Pulmonary/Chest: Effort normal.  Neurological: She is alert and oriented to person, place, and time.  Skin: Skin is warm and dry.  1 x 1 cm bump noted to left maxillary region in subcutaneous skin. Lesion is firm and indurated. No fluctuance, drainage, erythema or surrounding swelling.  Nursing note and vitals reviewed.    ED Treatments / Results  DIAGNOSTIC STUDIES: Oxygen Saturation is 100% on RA, normal by my interpretation.   COORDINATION OF CARE: 9:45 AM- Will prescribe antibiotic and encouraged pt to apply warm compresses. Advised pt to keep appt with PCP in three days. Return precautions discussed. Informed pt she could take OTC Tylenol or Motrin for pain. Pt verbalizes understanding and agrees to plan.  Medications - No data to display  Labs (all labs ordered are listed, but only abnormal results are displayed) Labs Reviewed - No data to display  EKG  EKG Interpretation None       Radiology No results found.  Procedures Procedures (including critical care time)  Medications Ordered in ED Medications - No data to display   Initial Impression / Assessment and Plan / ED Course  I have reviewed the triage vital signs and the nursing notes.  Pertinent labs & imaging results that were available during my care of the patient were reviewed by me and considered in my medical decision making (see chart for details).  Clinical Course     Patient presents with a lump to left side of her face for the past 3 days. Endorses having small amount of bloody purulent drainage 2 days ago. Denies fever or surrounding redness or swelling. VSS. Exam revealed  small cystic lesion to left maxillary subcutaneous skin, no fluctuance or drainage noted. No evidence of surrounding swelling, erythema or warmth to suggest cellulitis. Suspect lesion is likely epidermal cyst however due to patient reporting episode of purulent drainage will give patient prescription for Keflex for possible abscess. Discussed wound care and symptomatically treatment with patient. Advised patient to follow up with her PCP at her scheduled appointment in 3 days for wound recheck. Discussed her crit term precautions.  I personally performed the services described in this documentation, which was scribed in my presence. The recorded information has been reviewed and is accurate.   Final Clinical Impressions(s) / ED Diagnoses   Final diagnoses:  Skin cyst    New Prescriptions New Prescriptions   CEPHALEXIN (KEFLEX) 500 MG CAPSULE  Take 1 capsule (500 mg total) by mouth 4 (four) times daily.     Satira Sarkicole Elizabeth BarnsdallNadeau, [place] JerseyPA-C 10/08/16 1019    Shaune Pollackameron Isaacs, MD 10/09/16 1210

## 2016-10-08 NOTE — ED Triage Notes (Signed)
Pt reports L sided facial bump onset x 3 days with clear discharge from the area, pt reports hardening to the area, pt denies fever & chills, A&O x4

## 2016-10-08 NOTE — ED Notes (Signed)
Pt is in stable condition upon d/c and ambulates from ED. 

## 2016-10-08 NOTE — Discharge Instructions (Signed)
Take your medication as prescribed until completed. I also recommend applying warm compresses to the area for 15-20 minutes 3-4 times daily. You may take Tylenol or ibuprofen as prescribed over-the-counter as needed for pain relief. I recommend following up with your primary care provider at your scheduled appointment on Monday for wound recheck. Please return to the Emergency Department if symptoms worsen or new onset of fever, facial swelling, drainage, redness, unable to open the ear jaw, difficulty swallowing, vomiting.

## 2016-10-11 ENCOUNTER — Ambulatory Visit (INDEPENDENT_AMBULATORY_CARE_PROVIDER_SITE_OTHER): Payer: 59 | Admitting: Physician Assistant

## 2016-10-11 ENCOUNTER — Telehealth: Payer: Self-pay | Admitting: Internal Medicine

## 2016-10-11 ENCOUNTER — Encounter: Payer: Self-pay | Admitting: Physician Assistant

## 2016-10-11 VITALS — BP 110/80 | HR 77 | Temp 97.0°F | Resp 14 | Ht 62.5 in | Wt 164.8 lb

## 2016-10-11 DIAGNOSIS — R7303 Prediabetes: Secondary | ICD-10-CM

## 2016-10-11 DIAGNOSIS — Z0000 Encounter for general adult medical examination without abnormal findings: Secondary | ICD-10-CM

## 2016-10-11 DIAGNOSIS — Z79899 Other long term (current) drug therapy: Secondary | ICD-10-CM

## 2016-10-11 DIAGNOSIS — E559 Vitamin D deficiency, unspecified: Secondary | ICD-10-CM | POA: Diagnosis not present

## 2016-10-11 DIAGNOSIS — I10 Essential (primary) hypertension: Secondary | ICD-10-CM

## 2016-10-11 DIAGNOSIS — Z1389 Encounter for screening for other disorder: Secondary | ICD-10-CM

## 2016-10-11 DIAGNOSIS — R6889 Other general symptoms and signs: Secondary | ICD-10-CM | POA: Diagnosis not present

## 2016-10-11 DIAGNOSIS — K219 Gastro-esophageal reflux disease without esophagitis: Secondary | ICD-10-CM | POA: Diagnosis not present

## 2016-10-11 DIAGNOSIS — Z136 Encounter for screening for cardiovascular disorders: Secondary | ICD-10-CM | POA: Diagnosis not present

## 2016-10-11 DIAGNOSIS — Z0001 Encounter for general adult medical examination with abnormal findings: Secondary | ICD-10-CM

## 2016-10-11 DIAGNOSIS — Z13220 Encounter for screening for lipoid disorders: Secondary | ICD-10-CM | POA: Diagnosis not present

## 2016-10-11 DIAGNOSIS — J020 Streptococcal pharyngitis: Secondary | ICD-10-CM

## 2016-10-11 DIAGNOSIS — G43019 Migraine without aura, intractable, without status migrainosus: Secondary | ICD-10-CM

## 2016-10-11 LAB — CBC WITH DIFFERENTIAL/PLATELET
BASOS ABS: 108 {cells}/uL (ref 0–200)
BASOS PCT: 1 %
EOS ABS: 324 {cells}/uL (ref 15–500)
Eosinophils Relative: 3 %
HCT: 41.4 % (ref 35.0–45.0)
Hemoglobin: 13.6 g/dL (ref 11.7–15.5)
Lymphocytes Relative: 24 %
Lymphs Abs: 2592 cells/uL (ref 850–3900)
MCH: 27.2 pg (ref 27.0–33.0)
MCHC: 32.9 g/dL (ref 32.0–36.0)
MCV: 82.8 fL (ref 80.0–100.0)
MONOS PCT: 7 %
MPV: 9.6 fL (ref 7.5–12.5)
Monocytes Absolute: 756 cells/uL (ref 200–950)
NEUTROS ABS: 7020 {cells}/uL (ref 1500–7800)
Neutrophils Relative %: 65 %
PLATELETS: 341 10*3/uL (ref 140–400)
RBC: 5 MIL/uL (ref 3.80–5.10)
RDW: 14 % (ref 11.0–15.0)
WBC: 10.8 10*3/uL (ref 3.8–10.8)

## 2016-10-11 LAB — HEMOGLOBIN A1C
HEMOGLOBIN A1C: 5.8 % — AB (ref <5.7)
Mean Plasma Glucose: 120 mg/dL

## 2016-10-11 MED ORDER — AMOXICILLIN-POT CLAVULANATE 875-125 MG PO TABS: 1.0000 | ORAL_TABLET | Freq: Two times a day (BID) | ORAL | 0 refills | Status: DC

## 2016-10-11 MED ORDER — FLUCONAZOLE 150 MG PO TABS: 150.0000 mg | ORAL_TABLET | Freq: Every day | ORAL | 3 refills | Status: DC

## 2016-10-11 NOTE — Patient Instructions (Addendum)
[facility]  7 a.m.-6:30 p.m., Monday 7 a.m.-5 p.m., Tuesday-Friday Schedule an appointment by calling [phone].  Encourage you to get the 3D Mammogram  The 3D Mammogram is much more specific and sensitive to pick up breast cancer. For women with fibrocystic breast or lumpy breast it can be hard to determine if it is cancer or not but the 3D mammogram is able to tell this difference which cuts back on unneeded additional tests or scary call backs.   - over 40% increase in detection of breast cancer - over 40% reduction in false positives.  - fewer call backs - reduced anxiety - improved outcomes - [facility] [employer] [facility]   Strep Throat Strep throat is a bacterial infection of the throat. Your health care provider may call the infection tonsillitis or pharyngitis, depending on whether there is swelling in the tonsils or at the back of the throat. Strep throat is most common during the cold months of the year in children who are 185-43 years of age, but it can happen during any season in people of any age. This infection is spread from person to person (contagious) through coughing, sneezing, or close contact. What are the causes? Strep throat is caused by the bacteria called Streptococcus pyogenes. What increases the risk? This condition is more likely to develop in:  People who spend time in crowded places where the infection can spread easily.  People who have close contact with someone who has strep throat. What are the signs or symptoms? Symptoms of this condition include:  Fever or chills.  Redness, swelling, or pain in the tonsils or throat.  Pain or difficulty when swallowing.  White or yellow spots on the tonsils or throat.  Swollen, tender glands in the neck or under the jaw.  Red rash all over the body (rare). How is this diagnosed? This condition is diagnosed by performing a rapid strep test or by taking a swab of your throat (throat culture  test). Results from a rapid strep test are usually ready in a few minutes, but throat culture test results are available after one or two days. How is this treated? This condition is treated with antibiotic medicine. Follow these instructions at home: Medicines  Take over-the-counter and prescription medicines only as told by your health care provider.  Take your antibiotic as told by your health care provider. Do not stop taking the antibiotic even if you start to feel better.  Have family members who also have a sore throat or fever tested for strep throat. They may need antibiotics if they have the strep infection. Eating and drinking  Do not share food, drinking cups, or personal items that could cause the infection to spread to other people.  If swallowing is difficult, try eating soft foods until your sore throat feels better.  Drink enough fluid to keep your urine clear or pale yellow. General instructions  Gargle with a salt-water mixture 3-4 times per day or as needed. To make a salt-water mixture, completely dissolve -1 tsp of salt in 1 cup of warm water.  Make sure that all household members wash their hands well.  Get plenty of rest.  Stay home from school or work until you have been taking antibiotics for 24 hours.  Keep all follow-up visits as told by your health care provider. This is important. Contact a health care provider if:  The glands in your neck continue to get bigger.  You develop a rash,  cough, or earache.  You cough up a thick liquid that is green, yellow-brown, or bloody.  You have pain or discomfort that does not get better with medicine.  Your problems seem to be getting worse rather than better.  You have a fever. Get help right away if:  You have new symptoms, such as vomiting, severe headache, stiff or painful neck, chest pain, or shortness of breath.  You have severe throat pain, drooling, or changes in your voice.  You have swelling  of the neck, or the skin on the neck becomes red and tender.  You have signs of dehydration, such as fatigue, dry mouth, and decreased urination.  You become increasingly sleepy, or you cannot wake up completely.  Your joints become red or painful. This information is not intended to replace advice given to you by your health care provider. Make sure you discuss any questions you have with your health care provider. Document Released: 09/10/2000 Document Revised: 05/12/2016 Document Reviewed: 01/06/2015 Elsevier Interactive Patient Education  2017 [employer].

## 2016-10-11 NOTE — Progress Notes (Signed)
Complete Physical  Assessment and Plan:  Prediabetes - CBC with Differential/Platelet - BASIC METABOLIC PANEL WITH GFR - Hepatic function panel - Hemoglobin A1c - Insulin, fasting  Obesity Obesity with co morbidities- long discussion about weight loss, diet, and exercise - TSH  Gastroesophageal reflux disease, esophagitis presence not specified Continue PPI/H2 blocker, diet discussed   Routine general medical examination at a health care facility Get MGM, suggest 3D due to scar tissue  Screening cholesterol level - Lipid panel  Screening for blood or protein in urine - Urinalysis, Routine w reflex microscopic (not at [facility]) - Microalbumin / creatinine urine ratio  Vitamin D deficiency - VITAMIN D 25 Hydroxy (Vit-D Deficiency, Fractures)   Medication management - Magnesium  Acute streptococcal pharyngitis -     amoxicillin-clavulanate (AUGMENTIN) 875-125 MG tablet; Take 1 tablet by mouth 2 (two) times daily. 7 days -  take medications as prescribed, increase fluids,  Salt water gargles. If symptoms do not improve in 5-7 days or get worse contact the office or go to ER.  Screening for cardiovascular condition -     EKG 12-Lead  Other orders -     fluconazole (DIFLUCAN) 150 MG tablet; Take 1 tablet (150 mg total) by mouth daily.  Discussed med's effects and SE's. Screening labs and tests as requested with regular follow-up as recommended.  HPI  41 y.o. AA female  presents for a complete physical.   Her blood pressure has been controlled at home, today their BP is BP: 110/80 She does not workout. She denies chest pain, shortness of breath, dizziness.  She is on keflex for left cyst on face, went to ER, and then her daughter had strep, she has had sore throat x 2-3 days.  She is having surgery on right carpal tunnel and trigger finger Feb 27th.   She has been working on diet and exercise for prediabetes,  and denies paresthesia of the feet, polydipsia, polyuria and  visual disturbances. Last A1C in the office was:  Lab Results  Component Value Date   HGBA1C 6.2 (H) 10/09/2015   Lab Results  Component Value Date   CHOL 165 10/09/2015   HDL 53 10/09/2015   LDLCALC 82 10/09/2015   TRIG 151 (H) 10/09/2015   CHOLHDL 3.1 10/09/2015   Not married, 2 kids, 15 and 44. She works at [employer].  BMI is Body mass index is 29.66 kg/m., she is working on diet and exercise. Wt Readings from Last 3 Encounters:  10/11/16 164 lb 12.8 oz (74.8 kg)  10/08/16 160 lb (72.6 kg)  08/11/16 165 lb 9.6 oz (75.1 kg)    Current Medications:  Current Outpatient Prescriptions on File Prior to Visit  Medication Sig Dispense Refill  . cephALEXin (KEFLEX) 500 MG capsule Take 1 capsule (500 mg total) by mouth 4 (four) times daily. 28 capsule 0  . ibuprofen (ADVIL,MOTRIN) 800 MG tablet TAKE 1 TABLET (800 MG TOTAL) BY MOUTH EVERY 8 (EIGHT) HOURS AS NEEDED. 60 tablet 1  . mometasone (NASONEX) 50 MCG/ACT nasal spray Place 2 sprays into the nose daily. 17 g 2  . XULANE 150-35 MCG/24HR transdermal patch      No current facility-administered medications on file prior to visit.    Immunization History  Administered Date(s) Administered  . Influenza-Unspecified 07/07/2016  . Tdap 10/03/2014    Health Maintenance:   Tetanus: 2016 Pneumovax: Prevnar 13: due age 18 Flu vaccine: 2017 Zostavax:  LMP: Patient's last menstrual period was 09/28/2016 (approximate). Pap: 10/2013  neg due 2018 follows OB/GYN. Never had abnormal pap.  Not sexually active at this time [name] (girl) and [name] (son)  MGM: DUE NEEDS TO SCHEDULE  DEXA: Colonoscopy: EGD: CXR 2010 Last Dental Exam: None- looking Last Eye Exam: [employer] at mall  Patient Care Team: Lucky CowboyWilliam McKeown, MD as PCP - General (Internal Medicine) Quentin MullingAmanda Ayyan Sites, PA-C as Referring Physician (Physician Assistant)  Allergies: No Known Allergies Medical History:  Past Medical History:  Diagnosis Date   . Carpal tunnel syndrome on both sides    Has brace, has seen Dr. Eulah PontMurphy  . Carpal tunnel syndrome, bilateral 07/05/2016  . Common migraine with intractable migraine 06/22/2016  . GERD (gastroesophageal reflux disease)   . Obesity    BMI 34  . Prediabetes   . Trigger finger   . Vertigo    SURGICAL HISTORY She  has a past surgical history that includes Breast reduction surgery (Bilateral). FAMILY HISTORY Her family history includes Diabetes in her mother; Heart disease in her mother; Hypertension in her mother. SOCIAL HISTORY She  reports that she has never smoked. She has never used smokeless tobacco. She reports that she does not drink alcohol or use drugs.   Review of Systems: Review of Systems  Constitutional: Positive for malaise/fatigue. Negative for chills, diaphoresis, fever and weight loss.  HENT: Positive for sore throat. Negative for congestion, ear discharge, ear pain, hearing loss, nosebleeds, sinus pain and tinnitus.   Eyes: Negative.   Respiratory: Negative.  Negative for stridor.   Cardiovascular: Negative.   Gastrointestinal: Negative for abdominal pain, blood in stool, constipation, diarrhea, heartburn, melena, nausea and vomiting.  Genitourinary: Negative.   Musculoskeletal: Positive for joint pain and myalgias. Negative for back pain, falls and neck pain.  Skin: Negative.  Negative for rash.  Neurological: Negative.  Negative for weakness.  Psychiatric/Behavioral: Negative for depression, hallucinations, memory loss, substance abuse and suicidal ideas. The patient has insomnia. The patient is not nervous/anxious.     Physical Exam: Estimated body mass index is 29.66 kg/m as calculated from the following:   Height as of this encounter: 5' 2.5" (1.588 m).   Weight as of this encounter: 164 lb 12.8 oz (74.8 kg). BP 110/80   Pulse 77   Temp 97 F (36.1 C)   Resp 14   Ht 5' 2.5" (1.588 m)   Wt 164 lb 12.8 oz (74.8 kg)   LMP 09/28/2016 (Approximate)   SpO2  99%   BMI 29.66 kg/m  General Appearance: Well nourished, in no apparent distress.  Eyes: PERRLA, EOMs, conjunctiva no swelling or erythema, normal fundi and vessels.  Sinuses: No Frontal/maxillary tenderness  ENT/Mouth: Ext aud canals clear, normal light reflex with TMs without erythema, bulging, + bilateral effusions. Good dentition. + erythema on post pharynx with swelling and exudate on bilateral tonsils. Hearing normal. Crowded mouth.  Neck: Supple, thyroid normal. No bruits  Respiratory: Respiratory effort normal, BS equal bilaterally without rales, rhonchi, wheezing or stridor.  Cardio: RRR without murmurs, rubs or gallops. Brisk peripheral pulses without edema.  Chest: symmetric, with normal excursions and percussion.  Breasts: Symmetric, with lumps mobile/tender, well healed vertical scars below nipples from reduction without nipple discharge, without retractions and palpable scar tissue.  Abdomen: Soft, nontender, obese no guarding, rebound, hernias, masses, or organomegaly. .  Lymphatics: Non tender without lymphadenopathy.  Genitourinary: defer OB/GYN Musculoskeletal: Full ROM all peripheral extremities,5/5 strength, and normal gait.  Skin: Warm, dry without rashes, lesions, ecchymosis. Several tattoos.  Neuro: Cranial nerves intact, reflexes  equal bilaterally. Normal muscle tone, no cerebellar symptoms. Sensation intact.  Psych: Awake and oriented X 3, normal affect, Insight and Judgment appropriate.   EKG: WNL, IRBBB no ST changes   Quentin Mulling 9:15 AM [facility]

## 2016-10-11 NOTE — Telephone Encounter (Signed)
Please call patient to address Out of Work Note request. Does she need one, if so, for how long?

## 2016-10-12 ENCOUNTER — Encounter: Payer: Self-pay | Admitting: Physician Assistant

## 2016-10-12 ENCOUNTER — Ambulatory Visit: Payer: Managed Care, Other (non HMO) | Admitting: Adult Health

## 2016-10-12 LAB — URINALYSIS, ROUTINE W REFLEX MICROSCOPIC
Bilirubin Urine: NEGATIVE
GLUCOSE, UA: NEGATIVE
KETONES UR: NEGATIVE
LEUKOCYTES UA: NEGATIVE
Nitrite: NEGATIVE
PH: 6 (ref 5.0–8.0)
Protein, ur: NEGATIVE
SPECIFIC GRAVITY, URINE: 1.017 (ref 1.001–1.035)

## 2016-10-12 LAB — BASIC METABOLIC PANEL WITH GFR
BUN: 8 mg/dL (ref 7–25)
CHLORIDE: 104 mmol/L (ref 98–110)
CO2: 31 mmol/L (ref 20–31)
Calcium: 8.6 mg/dL (ref 8.6–10.2)
Creat: 0.81 mg/dL (ref 0.50–1.10)
GFR, Est African American: >89 mL/min (ref >=60)
GFR, Est Non African American: >89 mL/min (ref >=60)
Glucose, Bld: 83 mg/dL (ref 65–99)
POTASSIUM: 4.2 mmol/L (ref 3.5–5.3)
SODIUM: 136 mmol/L (ref 135–146)

## 2016-10-12 LAB — HEPATIC FUNCTION PANEL
ALT: 9 U/L (ref 6–29)
AST: 12 U/L (ref 10–30)
Albumin: 3.3 g/dL — ABNORMAL LOW (ref 3.6–5.1)
Alkaline Phosphatase: 44 U/L (ref 33–115)
BILIRUBIN DIRECT: 0.1 mg/dL (ref <=0.2)
BILIRUBIN TOTAL: 0.2 mg/dL (ref 0.2–1.2)
Indirect Bilirubin: 0.1 mg/dL — ABNORMAL LOW (ref 0.2–1.2)
Total Protein: 6.1 g/dL (ref 6.1–8.1)

## 2016-10-12 LAB — URINALYSIS, MICROSCOPIC ONLY
Bacteria, UA: NONE SEEN [HPF]
CASTS: NONE SEEN [LPF]
CRYSTALS: NONE SEEN [HPF]
YEAST: NONE SEEN [HPF]

## 2016-10-12 LAB — INSULIN, FASTING: Insulin fasting, serum: 5.5 u[IU]/mL (ref 2.0–19.6)

## 2016-10-12 LAB — MICROALBUMIN / CREATININE URINE RATIO
Creatinine, Urine: 153 mg/dL (ref 20–320)
MICROALB/CREAT RATIO: 31 ug/mg{creat} — AB (ref <30)
Microalb, Ur: 4.8 mg/dL

## 2016-10-12 LAB — MAGNESIUM: MAGNESIUM: 1.9 mg/dL (ref 1.5–2.5)

## 2016-10-12 LAB — LIPID PANEL
CHOL/HDL RATIO: 2 ratio (ref <5.0)
CHOLESTEROL: 135 mg/dL (ref <200)
HDL: 66 mg/dL (ref >50)
LDL Cholesterol: 54 mg/dL (ref <100)
TRIGLYCERIDES: 74 mg/dL (ref <150)
VLDL: 15 mg/dL (ref <30)

## 2016-10-12 LAB — VITAMIN D 25 HYDROXY (VIT D DEFICIENCY, FRACTURES): Vit D, 25-Hydroxy: 13 ng/mL — ABNORMAL LOW (ref 30–100)

## 2016-10-12 LAB — TSH: TSH: 1.41 mIU/L

## 2016-10-12 NOTE — Telephone Encounter (Signed)
Spoke with pt about being out of work & pt states she will come by around 1 to pick up work note.

## 2016-10-14 ENCOUNTER — Other Ambulatory Visit: Payer: Self-pay | Admitting: Internal Medicine

## 2016-10-20 ENCOUNTER — Ambulatory Visit: Payer: Managed Care, Other (non HMO) | Admitting: Adult Health

## 2016-10-21 ENCOUNTER — Encounter: Payer: Self-pay | Admitting: Adult Health

## 2016-10-24 ENCOUNTER — Other Ambulatory Visit: Payer: Self-pay | Admitting: Neurology

## 2016-11-11 ENCOUNTER — Ambulatory Visit (INDEPENDENT_AMBULATORY_CARE_PROVIDER_SITE_OTHER): Payer: 59 | Admitting: Physician Assistant

## 2016-11-11 ENCOUNTER — Encounter: Payer: Self-pay | Admitting: Physician Assistant

## 2016-11-11 VITALS — BP 140/92 | HR 86 | Temp 97.5°F | Resp 16 | Ht 62.5 in | Wt 163.0 lb

## 2016-11-11 DIAGNOSIS — R1114 Bilious vomiting: Secondary | ICD-10-CM | POA: Diagnosis not present

## 2016-11-11 DIAGNOSIS — H8110 Benign paroxysmal vertigo, unspecified ear: Secondary | ICD-10-CM | POA: Diagnosis not present

## 2016-11-11 DIAGNOSIS — I10 Essential (primary) hypertension: Secondary | ICD-10-CM | POA: Diagnosis not present

## 2016-11-11 MED ORDER — MECLIZINE HCL 25 MG PO TABS: ORAL_TABLET | ORAL | 0 refills | Status: DC

## 2016-11-11 MED ORDER — ONDANSETRON HCL 4 MG PO TABS: 4.0000 mg | ORAL_TABLET | Freq: Every day | ORAL | 1 refills | Status: AC | PRN

## 2016-11-11 MED ORDER — PROMETHAZINE HCL 25 MG/ML IJ SOLN
25.0000 mg | Freq: Once | INTRAMUSCULAR | Status: AC
  Administered 2016-11-11: 25 mg via INTRAMUSCULAR

## 2016-11-11 NOTE — Progress Notes (Signed)
Subjective:    Patient ID: Patricia Boone, female    DOB: [birth year], 41 y.o.   MRN: [id number]  HPI 41 y.o. AAF presents with vertigo, last episode 3 years ago. Start while at work, was tried at work, laid her head down at work and when she lifted her head up she felt vertigo, vomited x 2, had incontinence x 2 with vomiting. No HA, changes in vision, speech, no weakness, no abnormal color to urine, no dysuria. Mom drove her here.   Blood pressure (!) 140/92, pulse 86, temperature 97.5 F (36.4 C), resp. rate 16, height 5' 2.5" (1.588 m), weight 163 lb (73.9 kg), last menstrual period 10/05/2016, SpO2 98 %.  Medications Current Outpatient Prescriptions on File Prior to Visit  Medication Sig  . amoxicillin-clavulanate (AUGMENTIN) 875-125 MG tablet Take 1 tablet by mouth 2 (two) times daily. 7 days  . fluconazole (DIFLUCAN) 150 MG tablet Take 1 tablet (150 mg total) by mouth daily.  Marland Kitchen ibuprofen (ADVIL,MOTRIN) 800 MG tablet TAKE 1 TABLET (800 MG TOTAL) BY MOUTH EVERY 8 (EIGHT) HOURS AS NEEDED.  Marland Kitchen mometasone (NASONEX) 50 MCG/ACT nasal spray Place 2 sprays into the nose daily.  Burr Medico 150-35 MCG/24HR transdermal patch    No current facility-administered medications on file prior to visit.     Problem list She has Prediabetes; GERD (gastroesophageal reflux disease); Obesity; Paresthesia; Common migraine with intractable migraine; and Carpal tunnel syndrome, bilateral on her problem list.  Review of Systems  Constitutional: Negative.  Negative for chills, fatigue and fever.  HENT: Negative for congestion, dental problem, drooling, ear discharge, ear pain, facial swelling, hearing loss, mouth sores, nosebleeds, postnasal drip, rhinorrhea, sinus pressure, sneezing, sore throat, tinnitus, trouble swallowing and voice change.   Eyes: Negative.  Negative for visual disturbance.  Respiratory: Negative.   Cardiovascular: Negative.   Gastrointestinal: Positive for nausea and vomiting. Negative for  abdominal pain, constipation and diarrhea.  Genitourinary: Negative.  Negative for decreased urine volume, difficulty urinating, flank pain, frequency, pelvic pain and urgency.  Musculoskeletal: Negative.  Negative for neck pain and neck stiffness.  Neurological: Positive for dizziness. Negative for tremors, seizures, syncope, facial asymmetry, speech difficulty, weakness, light-headedness, numbness and headaches.  Psychiatric/Behavioral: Negative.        Objective:   Physical Exam  Constitutional: She is oriented to person, place, and time. She appears well-developed and well-nourished. No distress.  HENT:  Head: Atraumatic.  Right Ear: External ear normal.  Left Ear: External ear normal.  Nose: Nose normal.  Mouth/Throat: Oropharynx is clear and moist. No oropharyngeal exudate.  Eyes: Conjunctivae and EOM are normal. Pupils are equal, round, and reactive to light.  Neck: Normal range of motion. Neck supple.  Cardiovascular: Normal rate, regular rhythm and normal heart sounds.   Pulmonary/Chest: Effort normal and breath sounds normal. She has no wheezes.  Abdominal: Soft. Bowel sounds are normal. There is no tenderness.  Musculoskeletal: Normal range of motion.  Neurological: She is alert and oriented to person, place, and time. She has normal strength. She displays normal reflexes. No cranial nerve deficit or sensory deficit. Coordination and gait normal.  Dizziness worse with moving her head/change in positions  Skin: She is not diaphoretic.       Assessment & Plan:  1. Benign paroxysmal positional vertigo, unspecified laterality with vomiting History of vertigo, normal neuro, out of work note until 19th Call if not better or go to Er if any worsening symptoms - meclizine (ANTIVERT) 25 MG tablet; 1/2-1 pill up  to 3 times daily for motion sickness/dizziness  Dispense: 60 tablet; Refill: 0 - promethazine (PHENERGAN) injection 25 mg; Inject 1 mL (25 mg total) into the muscle  once. - ondansetron (ZOFRAN) 4 MG tablet; Take 1 tablet (4 mg total) by mouth daily as needed for nausea or vomiting.  Dispense: 30 tablet; Refill: 1  2. Hypertension Monitor BP at home, call if stays above 140/90  The patient was advised to call immediately if she has any concerning symptoms in the interval. The patient voices understanding of current treatment options and is in agreement with the current care plan.The patient knows to call the clinic with any problems, questions or concerns or go to the ER if any further progression of symptoms.

## 2016-11-11 NOTE — Patient Instructions (Signed)
Do bland diet, push fluids if worsening HA, changes vision/speech, imbalance, weakness go to the ER   Benign Positional Vertigo Introduction Vertigo is the feeling that you or your surroundings are moving when they are not. Benign positional vertigo is the most common form of vertigo. The cause of this condition is not serious (is benign). This condition is triggered by certain movements and positions (is positional). This condition can be dangerous if it occurs while you are doing something that could endanger you or others, such as driving. What are the causes? In many cases, the cause of this condition is not known. It may be caused by a disturbance in an area of the inner ear that helps your brain to sense movement and balance. This disturbance can be caused by a viral infection (labyrinthitis), head injury, or repetitive motion. What increases the risk? This condition is more likely to develop in:  Women.  People who are 43 years of age or older. What are the signs or symptoms? Symptoms of this condition usually happen when you move your head or your eyes in different directions. Symptoms may start suddenly, and they usually last for less than a minute. Symptoms may include:  Loss of balance and falling.  Feeling like you are spinning or moving.  Feeling like your surroundings are spinning or moving.  Nausea and vomiting.  Blurred vision.  Dizziness.  Involuntary eye movement (nystagmus). Symptoms can be mild and cause only slight annoyance, or they can be severe and interfere with daily life. Episodes of benign positional vertigo may return (recur) over time, and they may be triggered by certain movements. Symptoms may improve over time. How is this diagnosed? This condition is usually diagnosed by medical history and a physical exam of the head, neck, and ears. You may be referred to a health care provider who specializes in ear, nose, and throat (ENT) problems  (otolaryngologist) or a provider who specializes in disorders of the nervous system (neurologist). You may have additional testing, including:  MRI.  A CT scan.  Eye movement tests. Your health care provider may ask you to change positions quickly while he or she watches you for symptoms of benign positional vertigo, such as nystagmus. Eye movement may be tested with an electronystagmogram (ENG), caloric stimulation, the Dix-Hallpike test, or the roll test.  An electroencephalogram (EEG). This records electrical activity in your brain.  Hearing tests. How is this treated? Usually, your health care provider will treat this by moving your head in specific positions to adjust your inner ear back to normal. Surgery may be needed in severe cases, but this is rare. In some cases, benign positional vertigo may resolve on its own in 2-4 weeks. Follow these instructions at home: Safety  Move slowly.Avoid sudden body or head movements.  Avoid driving.  Avoid operating heavy machinery.  Avoid doing any tasks that would be dangerous to you or others if a vertigo episode would occur.  If you have trouble walking or keeping your balance, try using a cane for stability. If you feel dizzy or unstable, sit down right away.  Return to your normal activities as told by your health care provider. Ask your health care provider what activities are safe for you. General instructions  Take over-the-counter and prescription medicines only as told by your health care provider.  Avoid certain positions or movements as told by your health care provider.  Drink enough fluid to keep your urine clear or pale yellow.  Keep all  follow-up visits as told by your health care provider. This is important. Contact a health care provider if:  You have a fever.  Your condition gets worse or you develop new symptoms.  Your family or friends notice any behavioral changes.  Your nausea or vomiting gets worse.  You  have numbness or a "pins and needles" sensation. Get help right away if:  You have difficulty speaking or moving.  You are always dizzy.  You faint.  You develop severe headaches.  You have weakness in your legs or arms.  You have changes in your hearing or vision.  You develop a stiff neck.  You develop sensitivity to light. This information is not intended to replace advice given to you by your health care provider. Make sure you discuss any questions you have with your health care provider. Document Released: 06/21/2006 Document Revised: 02/19/2016 Document Reviewed: 01/06/2015  2017 Elsevier

## 2016-11-15 ENCOUNTER — Other Ambulatory Visit: Payer: Self-pay | Admitting: Neurology

## 2016-11-18 ENCOUNTER — Encounter (HOSPITAL_BASED_OUTPATIENT_CLINIC_OR_DEPARTMENT_OTHER): Payer: Self-pay | Admitting: *Deleted

## 2016-11-23 ENCOUNTER — Ambulatory Visit (HOSPITAL_BASED_OUTPATIENT_CLINIC_OR_DEPARTMENT_OTHER)
Admission: RE | Admit: 2016-11-23 | Discharge: 2016-11-23 | Disposition: A | Discharge status: Home / Self Care | Payer: 59 | Source: Ambulatory Visit | Attending: Orthopedic Surgery | Admitting: Orthopedic Surgery

## 2016-11-23 ENCOUNTER — Encounter (HOSPITAL_BASED_OUTPATIENT_CLINIC_OR_DEPARTMENT_OTHER): Payer: Self-pay | Admitting: *Deleted

## 2016-11-23 ENCOUNTER — Encounter (HOSPITAL_BASED_OUTPATIENT_CLINIC_OR_DEPARTMENT_OTHER)
Admission: RE | Disposition: A | Discharge status: Home / Self Care | Payer: Self-pay | Source: Ambulatory Visit | Attending: Orthopedic Surgery

## 2016-11-23 ENCOUNTER — Ambulatory Visit (HOSPITAL_BASED_OUTPATIENT_CLINIC_OR_DEPARTMENT_OTHER): Payer: 59 | Admitting: Certified Registered"

## 2016-11-23 DIAGNOSIS — Z833 Family history of diabetes mellitus: Secondary | ICD-10-CM | POA: Diagnosis not present

## 2016-11-23 DIAGNOSIS — R7303 Prediabetes: Secondary | ICD-10-CM | POA: Insufficient documentation

## 2016-11-23 DIAGNOSIS — M65841 Other synovitis and tenosynovitis, right hand: Secondary | ICD-10-CM | POA: Diagnosis not present

## 2016-11-23 DIAGNOSIS — K219 Gastro-esophageal reflux disease without esophagitis: Secondary | ICD-10-CM | POA: Insufficient documentation

## 2016-11-23 DIAGNOSIS — G5601 Carpal tunnel syndrome, right upper limb: Secondary | ICD-10-CM | POA: Diagnosis present

## 2016-11-23 DIAGNOSIS — G5603 Carpal tunnel syndrome, bilateral upper limbs: Secondary | ICD-10-CM | POA: Insufficient documentation

## 2016-11-23 HISTORY — PX: CARPAL TUNNEL RELEASE: SHX101

## 2016-11-23 HISTORY — PX: TRIGGER FINGER RELEASE: SHX641

## 2016-11-23 SURGERY — CARPAL TUNNEL RELEASE
Anesthesia: Regional | Laterality: Right

## 2016-11-23 MED ORDER — OXYCODONE HCL 5 MG PO TABS: 5.0000 mg | ORAL_TABLET | Freq: Once | ORAL | Status: DC | PRN

## 2016-11-23 MED ORDER — DEXAMETHASONE SODIUM PHOSPHATE 10 MG/ML IJ SOLN
INTRAMUSCULAR | Status: AC
  Filled 2016-11-23: qty 1

## 2016-11-23 MED ORDER — MIDAZOLAM HCL 2 MG/2ML IJ SOLN
1.0000 mg | INTRAMUSCULAR | Status: DC | PRN
  Administered 2016-11-23: 2 mg via INTRAVENOUS

## 2016-11-23 MED ORDER — MEPERIDINE HCL 25 MG/ML IJ SOLN: 6.2500 mg | INTRAMUSCULAR | Status: DC | PRN

## 2016-11-23 MED ORDER — BUPIVACAINE HCL (PF) 0.25 % IJ SOLN
INTRAMUSCULAR | Status: DC | PRN
  Administered 2016-11-23: 10 mL

## 2016-11-23 MED ORDER — LACTATED RINGERS IV SOLN: INTRAVENOUS | Status: DC

## 2016-11-23 MED ORDER — OXYCODONE HCL 5 MG/5ML PO SOLN: 5.0000 mg | Freq: Once | ORAL | Status: DC | PRN

## 2016-11-23 MED ORDER — LIDOCAINE HCL (CARDIAC) 20 MG/ML IV SOLN
INTRAVENOUS | Status: DC | PRN
  Administered 2016-11-23: 30 mg via INTRAVENOUS

## 2016-11-23 MED ORDER — CHLORHEXIDINE GLUCONATE 4 % EX LIQD: 60.0000 mL | Freq: Once | CUTANEOUS | Status: DC

## 2016-11-23 MED ORDER — FENTANYL CITRATE (PF) 100 MCG/2ML IJ SOLN
INTRAMUSCULAR | Status: AC
  Filled 2016-11-23: qty 2

## 2016-11-23 MED ORDER — ONDANSETRON HCL 4 MG/2ML IJ SOLN
INTRAMUSCULAR | Status: DC | PRN
  Administered 2016-11-23: 4 mg via INTRAVENOUS

## 2016-11-23 MED ORDER — LACTATED RINGERS IV SOLN
INTRAVENOUS | Status: DC
  Administered 2016-11-23: 07:00:00 via INTRAVENOUS

## 2016-11-23 MED ORDER — CEFAZOLIN SODIUM-DEXTROSE 2-4 GM/100ML-% IV SOLN
2.0000 g | INTRAVENOUS | Status: AC
  Administered 2016-11-23: 2 g via INTRAVENOUS

## 2016-11-23 MED ORDER — MIDAZOLAM HCL 2 MG/2ML IJ SOLN
INTRAMUSCULAR | Status: AC
  Filled 2016-11-23: qty 2

## 2016-11-23 MED ORDER — FENTANYL CITRATE (PF) 100 MCG/2ML IJ SOLN
50.0000 ug | INTRAMUSCULAR | Status: DC | PRN
  Administered 2016-11-23: 50 ug via INTRAVENOUS

## 2016-11-23 MED ORDER — LIDOCAINE 2% (20 MG/ML) 5 ML SYRINGE
INTRAMUSCULAR | Status: AC
  Filled 2016-11-23: qty 5

## 2016-11-23 MED ORDER — PROMETHAZINE HCL 25 MG/ML IJ SOLN: 6.2500 mg | INTRAMUSCULAR | Status: DC | PRN

## 2016-11-23 MED ORDER — PROPOFOL 500 MG/50ML IV EMUL
INTRAVENOUS | Status: DC | PRN
  Administered 2016-11-23: 75 ug/kg/min via INTRAVENOUS

## 2016-11-23 MED ORDER — ONDANSETRON HCL 4 MG/2ML IJ SOLN
INTRAMUSCULAR | Status: AC
  Filled 2016-11-23: qty 2

## 2016-11-23 MED ORDER — HYDROCODONE-ACETAMINOPHEN 5-325 MG PO TABS: 1.0000 | ORAL_TABLET | Freq: Four times a day (QID) | ORAL | 0 refills | Status: DC | PRN

## 2016-11-23 MED ORDER — CEFAZOLIN SODIUM-DEXTROSE 2-4 GM/100ML-% IV SOLN
INTRAVENOUS | Status: AC
  Filled 2016-11-23: qty 100

## 2016-11-23 MED ORDER — PROPOFOL 500 MG/50ML IV EMUL
INTRAVENOUS | Status: AC
  Filled 2016-11-23: qty 50

## 2016-11-23 MED ORDER — SCOPOLAMINE 1 MG/3DAYS TD PT72: 1.0000 | MEDICATED_PATCH | Freq: Once | TRANSDERMAL | Status: DC | PRN

## 2016-11-23 MED ORDER — FENTANYL CITRATE (PF) 100 MCG/2ML IJ SOLN: 25.0000 ug | INTRAMUSCULAR | Status: DC | PRN

## 2016-11-23 SURGICAL SUPPLY — 34 items
BANDAGE COBAN STERILE 2 (GAUZE/BANDAGES/DRESSINGS) ×2 IMPLANT
BLADE SURG 15 STRL LF DISP TIS (BLADE) ×1 IMPLANT
BLADE SURG 15 STRL SS (BLADE) ×2
BNDG CMPR 9X4 STRL LF SNTH (GAUZE/BANDAGES/DRESSINGS)
BNDG COHESIVE 3X5 TAN STRL LF (GAUZE/BANDAGES/DRESSINGS) ×2 IMPLANT
BNDG ESMARK 4X9 LF (GAUZE/BANDAGES/DRESSINGS) IMPLANT
BNDG GAUZE ELAST 4 BULKY (GAUZE/BANDAGES/DRESSINGS) ×2 IMPLANT
CHLORAPREP W/TINT 26ML (MISCELLANEOUS) ×2 IMPLANT
CORDS BIPOLAR (ELECTRODE) ×2 IMPLANT
COVER BACK TABLE 60X90IN (DRAPES) ×2 IMPLANT
COVER MAYO STAND STRL (DRAPES) ×2 IMPLANT
CUFF TOURNIQUET SINGLE 18IN (TOURNIQUET CUFF) ×2 IMPLANT
DECANTER SPIKE VIAL GLASS SM (MISCELLANEOUS) IMPLANT
DRAPE EXTREMITY T 121X128X90 (DRAPE) ×2 IMPLANT
DRAPE SURG 17X23 STRL (DRAPES) ×2 IMPLANT
DRSG PAD ABDOMINAL 8X10 ST (GAUZE/BANDAGES/DRESSINGS) ×2 IMPLANT
GAUZE SPONGE 4X4 12PLY STRL (GAUZE/BANDAGES/DRESSINGS) ×2 IMPLANT
GAUZE XEROFORM 1X8 LF (GAUZE/BANDAGES/DRESSINGS) ×2 IMPLANT
GLOVE BIOGEL PI IND STRL 8.5 (GLOVE) ×1 IMPLANT
GLOVE BIOGEL PI INDICATOR 8.5 (GLOVE) ×1
GLOVE SURG ORTHO 8.0 STRL STRW (GLOVE) ×2 IMPLANT
GOWN STRL REUS W/ TWL LRG LVL3 (GOWN DISPOSABLE) ×1 IMPLANT
GOWN STRL REUS W/TWL LRG LVL3 (GOWN DISPOSABLE) ×2
GOWN STRL REUS W/TWL XL LVL3 (GOWN DISPOSABLE) ×2 IMPLANT
NEEDLE PRECISIONGLIDE 27X1.5 (NEEDLE) ×2 IMPLANT
NS IRRIG 1000ML POUR BTL (IV SOLUTION) ×2 IMPLANT
PACK BASIN DAY SURGERY FS (CUSTOM PROCEDURE TRAY) ×2 IMPLANT
STOCKINETTE 4X48 STRL (DRAPES) ×2 IMPLANT
SUT ETHILON 4 0 PS 2 18 (SUTURE) ×2 IMPLANT
SUT VICRYL 4-0 PS2 18IN ABS (SUTURE) IMPLANT
SYR BULB 3OZ (MISCELLANEOUS) ×2 IMPLANT
SYR CONTROL 10ML LL (SYRINGE) ×2 IMPLANT
TOWEL OR 17X24 6PK STRL BLUE (TOWEL DISPOSABLE) ×4 IMPLANT
UNDERPAD 30X30 (UNDERPADS AND DIAPERS) ×2 IMPLANT

## 2016-11-23 NOTE — Op Note (Signed)
NAMECASSIOPEIA, FLORENTINO                 ACCOUNT NO.:  [id number]  MEDICAL RECORD NO.:  [id number]  LOCATION:                                 FACILITY:  PHYSICIAN:  Cindee Salt, M.D.            DATE OF BIRTH:  DATE OF PROCEDURE:  11/23/2016 DATE OF DISCHARGE:                              OPERATIVE REPORT   PREOPERATIVE DIAGNOSES:  Carpal tunnel syndrome, right hand; stenosing tenosynovitis, right ring finger.  POSTOPERATIVE DIAGNOSES:  Carpal tunnel syndrome, right hand; stenosing tenosynovitis, right ring finger.  OPERATION:  Release A1 pulley, right ring finger with decompression median nerve, right wrist.  SURGEON:  Cindee Salt, MD.  ANESTHESIA:  Forearm-based IV regional with local infiltration plus IV sedation.  PLACE OF SURGERY:  [facility].  HISTORY:  The patient is a 41 year old female with triggering of her right ring finger carpal tunnel syndrome with numbness and tingling. Nerve conduction is positive.  This has not responded to conservative treatment.  She has elected to undergo surgical release of the A1 pulley and decompression to the median nerve.  Pre, peri, and postoperative course have been discussed along with risks and complications.  She is aware that there is no guarantee to the surgery; possibility of infection; recurrence of injury to arteries, nerves, tendons; incomplete relief of symptoms and dystrophy.  In preoperative area, the patient was seen, the extremity marked by both patient and surgeon and antibiotic given.  PROCEDURE IN DETAIL:  The patient was brought to the operating room, where a forearm-based IV regional anesthetic was carried out without difficulty under the direction of the Anesthesia Department.  She was prepped using ChloraPrep and a supine position with right arm free.  A 3- minute dry time was allowed, and time-out taken, confirming the patient and procedure.  The trigger finger was addressed first.  An oblique incision  was made over the A1 pulley of the right ring finger.  This was carried down through subcutaneous tissue.  Bleeders were electrocauterized with bipolar.  The A1 pulley was identified. Retractors were placed radially and ulnarly protecting the neurovascular bundles.  The A1 pulley was then released on its radial aspect with a small incision made centrally in A2.  The tenosynovial tissue proximally was separated by separating the two tendons relieving any adherence between the tenosynovial tissue proximally.  The finger was placed through a full passive range of motion.  No further triggering was noted.  The wound was irrigated.  The skin was closed with interrupted 4- 0 nylon sutures.  A separate incision was then made longitudinally in the right palm.  This was carried down through subcutaneous tissue. Bleeders were electrocauterized with bipolar.  Palmar fascia was split. The superficial palmar arch was identified distally.  The flexor retinaculum was then incised longitudinally for a very minimal distance. This allowed visualization of the flexor tendons to the ring and little fingers.  Retractors were placed pulling the tendons radially along with the median nerve and retractors ulnarly protecting the ulnar nerve, ulnarly on the ulnar border of the flexor retinaculum, this was incised with sharp dissection.  Right angle and Lorna Dibble  retractor were placed between skin and forearm fascia.  The underlying contents were then dissected free from the distal forearm fascia with blunt scissors.  With the [facility] retractor and right-angle retractor in place, the distal forearm fascia was released for approximately a centimeter and a half to 2 cm proximal to the wrist crease under direct vision.  The canal was explored.  The area compression to the nerve was apparent.  The motor branch entered into muscle distally.  The wound was copiously irrigated with saline.  The skin was then closed with  interrupted 4-0 nylon sutures.  A local infiltration was given during the procedure and at the end for total of 10 mL of 0.25% bupivacaine without epinephrine. Following closure of the wound with a 4-0 nylon sutures, sterile compressive dressing with the fingers free was applied.  On deflation of the tourniquet, all fingers immediately pinked.  She was taken to the recovery room for observation in satisfactory condition.  She will be discharged to home to [facility] [place] [facility], on [place].          ______________________________ Cindee SaltGary Jasiel Belisle, M.D.     GK/MEDQ  D:  11/23/2016  T:  11/23/2016  Job:  [id number]

## 2016-11-23 NOTE — Discharge Instructions (Addendum)

## 2016-11-23 NOTE — Anesthesia Procedure Notes (Signed)
Anesthesia Regional Block: Bier block (IV Regional)   Pre-Anesthetic Checklist: ,, timeout performed, Correct Patient, Correct Site, Correct Laterality, Correct Procedure,, site marked, surgical consent,, at surgeon's request  Laterality: Right     Needles:  Injection technique: Single-shot  Needle Type: Other      Needle Gauge: 22     Additional Needles:   Procedures:,,,,,,, Esmarch exsanguination, single tourniquet utilized,  Narrative:   Performed by: Personally       

## 2016-11-23 NOTE — H&P (Signed)
Patricia Boone is an 41 y.o. female.   Chief Complaint:numbness right hand catching ring finger HPI: Patricia Boone is a 41 year old right-hand-dominant female who comes in with a complaint of bilateral numbness and tingling. She was referred by Dr. Anne Hahn. His notes are reviewed. She has had nerve conductions done revealing bilateral carpal tunnel syndrome. These were done on 07/05/2016. She is complaining of bilateral numbness and tingling of right side thumb and index finger on a full-time basis on her left side all fingers. . She has no history of injury to the hand or to the neck. She is waking 7 out of 7 nights. 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 She has been treated with ibuprofen and bracing without complete relief. She has no history of diabetes thyroid problems arthritis or gout. There is family history of diabetes no history of thyroid problems arthritis or gout in the family. She has been tested for diabetes.She had had an injection to her right side on 08/04/2016. Nerve conductions are positive being done by Dr. Anne Hahn. These were done in 07/05/2016. Thumb index are numb full time on her right side at the present time. She is been going on for at least 9-10 months. She is waking 7 out of 7 nights. She has no history of injury to the hand or to the neck. She states driving talking on her phone increased symptoms for her. She has been using braces meloxicam without relief. She has triggering of her right ring finger.              Past Medical History:  Diagnosis Date  . Carpal tunnel syndrome on both sides    Has brace, has seen Dr. Eulah Pont  . Carpal tunnel syndrome, bilateral 07/05/2016  . Common migraine with intractable migraine 06/22/2016  . GERD (gastroesophageal reflux disease)   . Prediabetes   . Trigger finger   . Vertigo     Past Surgical History:  Procedure Laterality Date  . BREAST REDUCTION SURGERY Bilateral    Age 40    Family History   Problem Relation Age of Onset  . Heart disease Mother   . Hypertension Mother   . Diabetes Mother    Social History:  reports that she has never smoked. She has never used smokeless tobacco. She reports that she does not drink alcohol or use drugs.  Allergies: No Known Allergies  No prescriptions prior to admission.    No results found for this or any previous visit (from the past 48 hour(s)).  No results found.   Pertinent items are noted in HPI.  Height 5' 2.5" (1.588 m), weight 73.9 kg (163 lb), last menstrual period 11/05/2016.  General appearance: alert, cooperative and appears stated age Head: Normocephalic, without obvious abnormality Neck: no JVD Resp: clear to auscultation bilaterally Cardio: regular rate and rhythm, S1, S2 normal, no murmur, click, rub or gallop GI: soft, non-tender; bowel sounds normal; no masses,  no organomegaly Extremities: numbness right hand catching right ring finger Pulses: 2+ and symmetric Skin: Skin color, texture, turgor normal. No rashes or lesions Neurologic: Grossly normal Incision/Wound: na  Assessment/Plan Assessment:  1. Bilateral carpal tunnel syndrome    Plan: Proceed to have a right carpal tunnel release. We recommend release of the A1 pulley of the right ring finger also at the same time.. She is aware that there is no guarantee to the surgery the possibility of infection recurrence injury to arteries nerves tendons possibility of dystrophy.  Scheduled for release A1 pulley right ring finger carpal tunnel release right hand as an outpatient under regional anesthesia. Questions are encouraged and answered to her satisfaction.      Patricia Boone R 11/23/2016, 6:03 AM

## 2016-11-23 NOTE — Anesthesia Postprocedure Evaluation (Addendum)
Anesthesia Post Note  Patient: Deb Sipos  Procedure(s) Performed: Procedure(s) (LRB): RIGHT CARPAL TUNNEL RELEASE (Right) RELEASE TRIGGER FINGER/A-1 PULLEY RIGHT RING FINGER (Right)  Patient location during evaluation: PACU Anesthesia Type: Bier Block Level of consciousness: awake and alert Pain management: pain level controlled Vital Signs Assessment: post-procedure vital signs reviewed and stable Respiratory status: spontaneous breathing, nonlabored ventilation, respiratory function stable and patient connected to nasal cannula oxygen Cardiovascular status: stable and blood pressure returned to baseline Anesthetic complications: no       Last Vitals:  Vitals:   11/23/16 0930 11/23/16 0937  BP: 121/87 118/83  Pulse: 74 68  Resp: 13 16  Temp:      Last Pain:  Vitals:   11/23/16 0915  TempSrc:   PainSc: 0-No pain                 Shelton SilvasKevin D Yaziel Brandon

## 2016-11-23 NOTE — Transfer of Care (Signed)
Immediate Anesthesia Transfer of Care Note  Patient: Patricia Boone  Procedure(s) Performed: Procedure(s): RIGHT CARPAL TUNNEL RELEASE (Right) RELEASE TRIGGER FINGER/A-1 PULLEY RIGHT RING FINGER (Right)  Patient Location: PACU  Anesthesia Type:Bier block  Level of Consciousness: awake, alert , oriented and patient cooperative  Airway & Oxygen Therapy: Patient Spontanous Breathing and Patient connected to face mask oxygen  Post-op Assessment: Report given to RN and Post -op Vital signs reviewed and stable  Post vital signs: Reviewed and stable  Last Vitals:  Vitals:   11/23/16 0659  BP: 119/78  Pulse: 82  Resp: 18  Temp: 37.2 C    Last Pain:  Vitals:   11/23/16 0659  TempSrc: Oral      Patients Stated Pain Goal: 0 (11/23/16 0659)  Complications: No apparent anesthesia complications

## 2016-11-23 NOTE — Op Note (Signed)
Dictation Number [id number]

## 2016-11-23 NOTE — Brief Op Note (Signed)
11/23/2016  9:19 AM  PATIENT:  Latrecia Stingley  41 y.o. female  PRE-OPERATIVE DIAGNOSIS:  RIGHT CARPAL TUNNEL SYNDROME,TRIGGER RIGHT RING FINGER  POST-OPERATIVE DIAGNOSIS:  RIGHT CARPAL TUNNEL SYNDROME,TRIGGER RIGHT RING FINGER  PROCEDURE:  Procedure(s): RIGHT CARPAL TUNNEL RELEASE (Right) RELEASE TRIGGER FINGER/A-1 PULLEY RIGHT RING FINGER (Right)  SURGEON:  Surgeon(s) and Role:    * Cindee SaltGary Selina Tapper, MD - Primary  PHYSICIAN ASSISTANT:   ASSISTANTS: none   ANESTHESIA:   local, regional and IV sedation  EBL:  Total I/O In: 500 [I.V.:500] Out: -   BLOOD ADMINISTERED:none  DRAINS: none   LOCAL MEDICATIONS USED:  BUPIVICAINE   SPECIMEN:  No Specimen  DISPOSITION OF SPECIMEN:  N/A  COUNTS:  YES  TOURNIQUET:   Total Tourniquet Time Documented: Forearm (Right) - 28 minutes Total: Forearm (Right) - 28 minutes   DICTATION: .Other Dictation: Dictation Number [id number]  PLAN OF CARE: Discharge to home after PACU  PATIENT DISPOSITION:  PACU - hemodynamically stable.

## 2016-11-23 NOTE — Anesthesia Preprocedure Evaluation (Addendum)
Anesthesia Evaluation  Patient identified by MRN, date of birth, ID band Patient awake    Reviewed: Allergy & Precautions, NPO status , Patient's Chart, lab work & pertinent test results  Airway Mallampati: II  TM Distance: >3 FB Neck ROM: Full    Dental  (+) Teeth Intact, Dental Advisory Given   Pulmonary neg pulmonary ROS,    breath sounds clear to auscultation       Cardiovascular negative cardio ROS   Rhythm:Regular Rate:Normal     Neuro/Psych  Headaches,  Neuromuscular disease negative psych ROS   GI/Hepatic Neg liver ROS, GERD  Medicated,  Endo/Other  negative endocrine ROS  Renal/GU negative Renal ROS  negative genitourinary   Musculoskeletal negative musculoskeletal ROS (+)   Abdominal   Peds negative pediatric ROS (+)  Hematology negative hematology ROS (+)   Anesthesia Other Findings   Reproductive/Obstetrics negative OB ROS                           Anesthesia Physical Anesthesia Plan  ASA: II  Anesthesia Plan: Bier Block   Post-op Pain Management:    Induction: Intravenous  Airway Management Planned: Natural Airway  Additional Equipment:   Intra-op Plan:   Post-operative Plan:   Informed Consent: I have reviewed the patients History and Physical, chart, labs and discussed the procedure including the risks, benefits and alternatives for the proposed anesthesia with the patient or authorized representative who has indicated his/her understanding and acceptance.     Plan Discussed with: CRNA  Anesthesia Plan Comments:         Anesthesia Quick Evaluation

## 2016-11-24 ENCOUNTER — Encounter (HOSPITAL_BASED_OUTPATIENT_CLINIC_OR_DEPARTMENT_OTHER): Payer: Self-pay | Admitting: Orthopedic Surgery

## 2016-12-01 DIAGNOSIS — M65341 Trigger finger, right ring finger: Secondary | ICD-10-CM | POA: Insufficient documentation

## 2016-12-28 ENCOUNTER — Other Ambulatory Visit: Payer: Self-pay | Admitting: Neurology

## 2017-01-10 ENCOUNTER — Other Ambulatory Visit: Payer: Self-pay | Admitting: Neurology

## 2017-02-15 ENCOUNTER — Other Ambulatory Visit: Payer: Self-pay | Admitting: Neurology

## 2017-02-15 ENCOUNTER — Encounter (HOSPITAL_COMMUNITY): Payer: Self-pay | Admitting: Emergency Medicine

## 2017-02-15 ENCOUNTER — Emergency Department (HOSPITAL_COMMUNITY)
Admission: EM | Admit: 2017-02-15 | Discharge: 2017-02-15 | Disposition: A | Discharge status: Home / Self Care | Payer: 59 | Attending: Emergency Medicine | Admitting: Emergency Medicine

## 2017-02-15 DIAGNOSIS — J029 Acute pharyngitis, unspecified: Secondary | ICD-10-CM | POA: Insufficient documentation

## 2017-02-15 DIAGNOSIS — Z79899 Other long term (current) drug therapy: Secondary | ICD-10-CM | POA: Insufficient documentation

## 2017-02-15 LAB — RAPID STREP SCREEN (MED CTR MEBANE ONLY): Streptococcus, Group A Screen (Direct): NEGATIVE

## 2017-02-15 MED ORDER — MAGIC MOUTHWASH W/LIDOCAINE: 5.0000 mL | Freq: Three times a day (TID) | ORAL | 0 refills | Status: DC | PRN

## 2017-02-15 MED ORDER — AMOXICILLIN 500 MG PO CAPS: 500.0000 mg | ORAL_CAPSULE | Freq: Two times a day (BID) | ORAL | 0 refills | Status: AC

## 2017-02-15 NOTE — ED Triage Notes (Signed)
Pt sts sore throat x 3 days  

## 2017-02-15 NOTE — Discharge Instructions (Signed)
Reattach information regarding sore throat. Take amoxicillin twice daily for 10 days. Use Magic mouthwash as directed. Return to ED for worsening pain, increased fevers, trouble breathing, trouble swallowing, increased drooling,, neck pain.

## 2017-02-15 NOTE — ED Provider Notes (Signed)
[facility]-EMERGENCY DEPT Provider Note   CSN: [id number] Arrival date & time: 02/15/17  0702     History   Chief Complaint Chief Complaint  Patient presents with  . Sore Throat    HPI Patricia Boone is a 41 y.o. female.  HPI  Patient presents with 3 day history of sore throat. She reports pain with swallowing. Has not checked if she had a fever but felt chills earlier yesterday. Has not taken anything for pain. Denies cough, drooling, trouble breathing, trouble swallowing.  Past Medical History:  Diagnosis Date  . Carpal tunnel syndrome on both sides    Has brace, has seen Dr. Eulah PontMurphy  . Carpal tunnel syndrome, bilateral 07/05/2016  . Common migraine with intractable migraine 06/22/2016  . GERD (gastroesophageal reflux disease)   . Prediabetes   . Trigger finger   . Vertigo     Patient Active Problem List   Diagnosis Date Noted  . Carpal tunnel syndrome, bilateral 07/05/2016  . Paresthesia 06/22/2016  . Common migraine with intractable migraine 06/22/2016  . Prediabetes   . GERD (gastroesophageal reflux disease)   . Obesity     Past Surgical History:  Procedure Laterality Date  . BREAST REDUCTION SURGERY Bilateral    Age 41  . CARPAL TUNNEL RELEASE Right 11/23/2016   Procedure: RIGHT CARPAL TUNNEL RELEASE;  Surgeon: Cindee SaltGary Kuzma, MD;  Location: [facility];  Service: Orthopedics;  Laterality: Right;  . TRIGGER FINGER RELEASE Right 11/23/2016   Procedure: RELEASE TRIGGER FINGER/A-1 PULLEY RIGHT RING FINGER;  Surgeon: Cindee SaltGary Kuzma, MD;  Location:  SURGERY CENTER;  Service: Orthopedics;  Laterality: Right;    OB History    Gravida Para Term Preterm AB Living   1             SAB TAB Ectopic Multiple Live Births                   Home Medications    Prior to Admission medications   Medication Sig Start Date End Date Taking? Authorizing Provider  amoxicillin (AMOXIL) 500 MG capsule Take 1 capsule (500 mg total) by mouth 2 (two) times daily. 02/15/17  02/25/17  Marnita Poirier, PA-C  fluconazole (DIFLUCAN) 150 MG tablet Take 1 tablet (150 mg total) by mouth daily. 10/11/16   Quentin Mullingollier, Amanda, PA-C  HYDROcodone-acetaminophen (NORCO) 5-325 MG tablet Take 1 tablet by mouth every 6 (six) hours as needed for moderate pain. 11/23/16   Cindee SaltKuzma, Gary, MD  ibuprofen (ADVIL,MOTRIN) 800 MG tablet TAKE 1 TABLET (800 MG TOTAL) BY MOUTH EVERY 8 (EIGHT) HOURS AS NEEDED. 11/15/16   York SpanielWillis, Charles K, MD  magic mouthwash w/lidocaine SOLN Take 5 mLs by mouth 3 (three) times daily as needed for mouth pain. 02/15/17   Jenavive Lamboy, Hillary BowHina, PA-C  meclizine (ANTIVERT) 25 MG tablet 1/2-1 pill up to 3 times daily for motion sickness/dizziness 11/11/16   Quentin Mullingollier, Amanda, PA-C  mometasone (NASONEX) 50 MCG/ACT nasal spray Place 2 sprays into the nose daily. 08/11/16 08/11/17  Quentin Mullingollier, Amanda, PA-C  ondansetron [facility](ZOFRAN) 4 MG tablet Take 1 tablet (4 mg total) by mouth daily as needed for nausea or vomiting. 11/11/16 11/11/17  Quentin Mullingollier, Amanda, PA-C  Burr MedicoXULANE 150-35 MCG/24HR transdermal patch  06/01/16   [provider]    Family History Family History  Problem Relation Age of Onset  . Heart disease Mother   . Hypertension Mother   . Diabetes Mother     Social History Social History  Substance Use Topics  .  Smoking status: Never Smoker  . Smokeless tobacco: Never Used  . Alcohol use No     Allergies   Patient has no known allergies.   Review of Systems Review of Systems  Constitutional: Positive for appetite change and chills. Negative for fever.  HENT: Positive for sore throat. Negative for ear pain, rhinorrhea and sneezing.   Eyes: Negative for photophobia and visual disturbance.  Respiratory: Negative for cough, chest tightness, shortness of breath and wheezing.   Cardiovascular: Negative for chest pain and palpitations.  Gastrointestinal: Negative for abdominal pain, blood in stool, nausea and vomiting.  Musculoskeletal: Negative for myalgias.  Skin: Negative for  rash.  Neurological: Negative for dizziness, weakness and light-headedness.     Physical Exam Updated Vital Signs BP 122/86 (BP Location: Right Arm)   Pulse 85   Temp 98.7 F (37.1 C) (Oral)   Resp 18   Ht 5' (1.524 m)   Wt 72.6 kg (160 lb)   SpO2 100%   BMI 31.25 kg/m   Physical Exam  Constitutional: She appears well-developed and well-nourished. No distress.  HENT:  Head: Normocephalic and atraumatic.  Mouth/Throat: Uvula is midline, oropharynx is clear and moist and mucous membranes are normal. Tonsillar exudate (right-sided tonsillar exudate.).  Eyes: Conjunctivae and EOM are normal. No scleral icterus.  Neck: Normal range of motion.  Cardiovascular: Normal rate and regular rhythm.   Pulmonary/Chest: Effort normal and breath sounds normal. No respiratory distress.  Lymphadenopathy:    She has no cervical adenopathy.  Neurological: She is alert.  Skin: No rash noted. She is not diaphoretic.  Psychiatric: She has a normal mood and affect.  Nursing note and vitals reviewed.     ED Treatments / Results  Labs (all labs ordered are listed, but only abnormal results are displayed) Labs Reviewed  RAPID STREP SCREEN (NOT AT [facility])  CULTURE, GROUP A STREP [facility])    EKG  EKG Interpretation None       Radiology No results found.  Procedures Procedures (including critical care time)  Medications Ordered in ED Medications - No data to display   Initial Impression / Assessment and Plan / ED Course  I have reviewed the triage vital signs and the nursing notes.  Pertinent labs & imaging results that were available during my care of the patient were reviewed by me and considered in my medical decision making (see chart for details).     Patient's history and symptoms concerning for strep pharyngitis versus viral pharyngitis. Patient reports history of sore throat and subjective fever. She is afebrile here today. There is tonsillar exudate present on physical exam.  Patient does not appear to be in respiratory distress and is tolerating secretions well. No concern for PTA or RPA. And good neck movement and no tenderness to palpation. Will give amoxicillin and Magic mouthwash for improvement in symptoms. Advised to continue ibuprofen or Tylenol as needed for pain and fever. Strict return precautions given.  Final Clinical Impressions(s) / ED Diagnoses   Final diagnoses:  Pharyngitis, unspecified etiology    New Prescriptions Discharge Medication List as of 02/15/2017  8:43 AM    START taking these medications   Details  amoxicillin (AMOXIL) 500 MG capsule Take 1 capsule (500 mg total) by mouth 2 (two) times daily., Starting Tue 02/15/2017, Until Fri 02/25/2017, Print    magic mouthwash w/lidocaine SOLN Take 5 mLs by mouth 3 (three) times daily as needed for mouth pain., Starting Tue 02/15/2017, Print  Dietrich Pates, PA-C 02/15/17 1356    Little, Ambrose Finland, MD 02/16/17 [phone]

## 2017-02-16 LAB — CULTURE, GROUP A STREP (THRC)

## 2017-02-25 NOTE — Addendum Note (Signed)
Addendum  created 02/25/17 1043 by Wanell Lorenzi D, MD   Sign clinical note    

## 2017-10-12 NOTE — Progress Notes (Deleted)
Complete Physical  Assessment and Plan:  Discussed med's effects and SE's. Screening labs and tests as requested with regular follow-up as recommended.  HPI  42 y.o. AA female  presents for a complete physical.   Her blood pressure has been controlled at home, today their BP is   She does not workout. She denies chest pain, shortness of breath, dizziness.  She had carpal tunnel surgery.   She has been working on diet and exercise for prediabetes,  and denies paresthesia of the feet, polydipsia, polyuria and visual disturbances. Last A1C in the office was:  Lab Results  Component Value Date   HGBA1C 5.8 (H) 10/11/2016   Lab Results  Component Value Date   CHOL 135 10/11/2016   HDL 66 10/11/2016   LDLCALC 54 10/11/2016   TRIG 74 10/11/2016   CHOLHDL 2.0 10/11/2016   Not married, 2 kids, 15 and 3313. She works at [employer].  BMI is There is no height or weight on file to calculate BMI., she is working on diet and exercise. Wt Readings from Last 3 Encounters:  02/15/17 160 lb (72.6 kg)  11/23/16 162 lb 6.4 oz (73.7 kg)  11/11/16 163 lb (73.9 kg)    Current Medications:  Current Outpatient Medications on File Prior to Visit  Medication Sig Dispense Refill  . fluconazole (DIFLUCAN) 150 MG tablet Take 1 tablet (150 mg total) by mouth daily. 1 tablet 3  . HYDROcodone-acetaminophen (NORCO) 5-325 MG tablet Take 1 tablet by mouth every 6 (six) hours as needed for moderate pain. 25 tablet 0  . ibuprofen (ADVIL,MOTRIN) 800 MG tablet TAKE 1 TABLET (800 MG TOTAL) BY MOUTH EVERY 8 (EIGHT) HOURS AS NEEDED. 60 tablet 1  . magic mouthwash w/lidocaine SOLN Take 5 mLs by mouth 3 (three) times daily as needed for mouth pain. 100 mL 0  . meclizine (ANTIVERT) 25 MG tablet 1/2-1 pill up to 3 times daily for motion sickness/dizziness 60 tablet 0  . mometasone (NASONEX) 50 MCG/ACT nasal spray Place 2 sprays into the nose daily. 17 g 2  . ondansetron (ZOFRAN) 4 MG tablet Take 1 tablet  (4 mg total) by mouth daily as needed for nausea or vomiting. 30 tablet 1  . XULANE 150-35 MCG/24HR transdermal patch      No current facility-administered medications on file prior to visit.    Immunization History  Administered Date(s) Administered  . Influenza-Unspecified 07/07/2016  . Tdap 10/03/2014    Health Maintenance:   Tetanus: 2016 Pneumovax: Prevnar 13: due age 42 Flu vaccine: 2017 Zostavax:  LMP: No LMP recorded. Pap: 10/2013 neg due 2018 follows OB/GYN. Never had abnormal pap.  Not sexually active at this time Patricia Boone (girl) and Patricia Boone (son)  MGM: DUE NEEDS TO SCHEDULE  DEXA: Colonoscopy: EGD: CXR 2010 Last Dental Exam: None- looking Last Eye Exam: [employer] at mall  Patient Care Team: Lucky CowboyMcKeown, William, MD as PCP - General (Internal Medicine) Quentin Mullingollier, Kivon Aprea, PA-C as Referring Physician (Physician Assistant)  Allergies: No Known Allergies Medical History:  Past Medical History:  Diagnosis Date  . Carpal tunnel syndrome on both sides    Has brace, has seen Dr. Eulah PontMurphy  . Carpal tunnel syndrome, bilateral 07/05/2016  . Common migraine with intractable migraine 06/22/2016  . GERD (gastroesophageal reflux disease)   . Prediabetes   . Trigger finger   . Vertigo    SURGICAL HISTORY She  has a past surgical history that includes Breast reduction surgery (Bilateral); Carpal tunnel release (Right, 11/23/2016); and  Trigger finger release (Right, 11/23/2016). FAMILY HISTORY Her family history includes Diabetes in her mother; Heart disease in her mother; Hypertension in her mother. SOCIAL HISTORY She  reports that  has never smoked. she has never used smokeless tobacco. She reports that she does not drink alcohol or use drugs.   Review of Systems: Review of Systems  Constitutional: Negative for chills, diaphoresis, fever, malaise/fatigue and weight loss.  HENT: Negative for congestion, ear discharge, ear pain, hearing loss, nosebleeds, sinus pain, sore throat  and tinnitus.   Eyes: Negative.   Respiratory: Negative.  Negative for stridor.   Cardiovascular: Negative.   Gastrointestinal: Negative for abdominal pain, blood in stool, constipation, diarrhea, heartburn, melena, nausea and vomiting.  Genitourinary: Negative.   Musculoskeletal: Negative for back pain, falls, joint pain, myalgias and neck pain.  Skin: Negative.  Negative for rash.  Neurological: Negative.  Negative for weakness.  Psychiatric/Behavioral: Negative for depression, hallucinations, memory loss, substance abuse and suicidal ideas. The patient is not nervous/anxious and does not have insomnia.     Physical Exam: Estimated body mass index is 31.25 kg/m as calculated from the following:   Height as of 02/15/17: 5' (1.524 m).   Weight as of 02/15/17: 160 lb (72.6 kg). There were no vitals taken for this visit. General Appearance: Well nourished, in no apparent distress.  Eyes: PERRLA, EOMs, conjunctiva no swelling or erythema, normal fundi and vessels.  Sinuses: No Frontal/maxillary tenderness  ENT/Mouth: Ext aud canals clear, normal light reflex with TMs without erythema, bulging, + bilateral effusions. Good dentition. + erythema on post pharynx with swelling and exudate on bilateral tonsils. Hearing normal. Crowded mouth.  Neck: Supple, thyroid normal. No bruits  Respiratory: Respiratory effort normal, BS equal bilaterally without rales, rhonchi, wheezing or stridor.  Cardio: RRR without murmurs, rubs or gallops. Brisk peripheral pulses without edema.  Chest: symmetric, with normal excursions and percussion.  Breasts: Symmetric, with lumps mobile/tender, well healed vertical scars below nipples from reduction without nipple discharge, without retractions and palpable scar tissue.  Abdomen: Soft, nontender, obese no guarding, rebound, hernias, masses, or organomegaly. .  Lymphatics: Non tender without lymphadenopathy.  Genitourinary: defer OB/GYN Musculoskeletal: Full ROM all  peripheral extremities,5/5 strength, and normal gait.  Skin: Warm, dry without rashes, lesions, ecchymosis. Several tattoos.  Neuro: Cranial nerves intact, reflexes equal bilaterally. Normal muscle tone, no cerebellar symptoms. Sensation intact.  Psych: Awake and oriented X 3, normal affect, Insight and Judgment appropriate.   EKG: WNL, IRBBB no ST changes   Quentin Mulling 12:22 PM [facility]

## 2017-10-13 ENCOUNTER — Encounter: Payer: Self-pay | Admitting: Physician Assistant

## 2017-10-18 ENCOUNTER — Other Ambulatory Visit: Payer: Self-pay | Admitting: Internal Medicine

## 2017-10-18 DIAGNOSIS — Z1231 Encounter for screening mammogram for malignant neoplasm of breast: Secondary | ICD-10-CM

## 2017-11-08 ENCOUNTER — Ambulatory Visit: Payer: 59

## 2018-01-27 ENCOUNTER — Encounter (HOSPITAL_COMMUNITY): Payer: Self-pay | Admitting: Emergency Medicine

## 2018-01-27 ENCOUNTER — Ambulatory Visit (HOSPITAL_COMMUNITY)
Admission: EM | Admit: 2018-01-27 | Discharge: 2018-01-27 | Disposition: A | Discharge status: Home / Self Care | Payer: BLUE CROSS/BLUE SHIELD | Attending: Family Medicine | Admitting: Family Medicine

## 2018-01-27 DIAGNOSIS — J029 Acute pharyngitis, unspecified: Secondary | ICD-10-CM | POA: Insufficient documentation

## 2018-01-27 DIAGNOSIS — J039 Acute tonsillitis, unspecified: Secondary | ICD-10-CM

## 2018-01-27 DIAGNOSIS — R7303 Prediabetes: Secondary | ICD-10-CM | POA: Insufficient documentation

## 2018-01-27 LAB — POCT RAPID STREP A: Streptococcus, Group A Screen (Direct): NEGATIVE

## 2018-01-27 LAB — POCT INFECTIOUS MONO SCREEN: Mono Screen: NEGATIVE

## 2018-01-27 MED ORDER — LIDOCAINE VISCOUS 2 % MT SOLN: 15.0000 mL | OROMUCOSAL | 0 refills | Status: DC | PRN

## 2018-01-27 MED ORDER — AMOXICILLIN 500 MG PO CAPS: 1000.0000 mg | ORAL_CAPSULE | ORAL | 0 refills | Status: DC

## 2018-01-27 MED ORDER — FLUCONAZOLE 200 MG PO TABS: 200.0000 mg | ORAL_TABLET | Freq: Every day | ORAL | 0 refills | Status: AC

## 2018-01-27 MED ORDER — AMOXICILLIN 500 MG PO CAPS: 1000.0000 mg | ORAL_CAPSULE | Freq: Every day | ORAL | 0 refills | Status: AC

## 2018-01-27 NOTE — Discharge Instructions (Addendum)
Strep was negative.  We will send out for culture and notify you of the results Mono was negative Drink plenty of water and get rest Do salt water gargles, drink warm liquids, eat/ drink cool liquids or foods such as popsicles Prescribed viscous lidocaine as needed for symptomatic relief Use OTC tylenol and/ or ibuprofen as needed for symptomatic relief Prescription for amoxicillin given.  Patient will fill medication if symptoms do not improve in 2 days Prescribed diflucan for empiric treatment of yeast infection following course of antibiotic Follow up with PCP if symptoms persist Return or go to ER if you have any new or worsening symptoms

## 2018-01-27 NOTE — ED Triage Notes (Signed)
Pt c/o sore throat x3 days, pt c/o hot and cold chills.

## 2018-01-27 NOTE — ED Provider Notes (Signed)
[facility]    CSN: [id number] Arrival date & time: 01/27/18  1048     History   Chief Complaint Chief Complaint  Patient presents with  . Sore Throat    HPI Patricia Boone is a 42 y.o. female.   Complains of sore throat that began 3 days ago.  She denies a precipitating event or sick contact exposure.  She describes the pain as constant and sore in character.  She has tried ibuprofen with temporary relief.  Her symptoms are made worse with swallowing.  She admits to having strep throat every year, and her symptoms are similar.     Past Medical History:  Diagnosis Date  . Carpal tunnel syndrome on both sides    Has brace, has seen Dr. Eulah Pont  . Carpal tunnel syndrome, bilateral 07/05/2016  . Common migraine with intractable migraine 06/22/2016  . GERD (gastroesophageal reflux disease)   . Prediabetes   . Trigger finger   . Vertigo     Patient Active Problem List   Diagnosis Date Noted  . Carpal tunnel syndrome, bilateral 07/05/2016  . Paresthesia 06/22/2016  . Common migraine with intractable migraine 06/22/2016  . Prediabetes   . GERD (gastroesophageal reflux disease)   . Obesity     Past Surgical History:  Procedure Laterality Date  . BREAST REDUCTION SURGERY Bilateral    Age 67  . CARPAL TUNNEL RELEASE Right 11/23/2016   Procedure: RIGHT CARPAL TUNNEL RELEASE;  Surgeon: Cindee Salt, MD;  Location: [facility];  Service: Orthopedics;  Laterality: Right;  . TRIGGER FINGER RELEASE Right 11/23/2016   Procedure: RELEASE TRIGGER FINGER/A-1 PULLEY RIGHT RING FINGER;  Surgeon: Cindee Salt, MD;  Location: [facility];  Service: Orthopedics;  Laterality: Right;    OB History    Gravida  1   Para      Term      Preterm      AB      Living        SAB      TAB      Ectopic      Multiple      Live Births               Home Medications    Prior to Admission medications   Medication Sig Start Date End Date  Taking? Authorizing Provider  amoxicillin (AMOXIL) 500 MG capsule Take 2 capsules (1,000 mg total) by mouth 1 day or 1 dose for 10 doses. 01/27/18 02/06/18  Shamela Haydon, Lowanda Foster, PA-C  fluconazole (DIFLUCAN) 200 MG tablet Take 1 tablet (200 mg total) by mouth daily for 2 days. Take 1 tablet by mouth and then wait 72 hours then take the second tablet by mouth 01/27/18 01/29/18  Gracelee Stemmler, [place], PA-C  HYDROcodone-acetaminophen (NORCO) 5-325 MG tablet Take 1 tablet by mouth every 6 (six) hours as needed for moderate pain. Patient not taking: Reported on 01/27/2018 11/23/16   Cindee Salt, MD  ibuprofen (ADVIL,MOTRIN) 800 MG tablet TAKE 1 TABLET (800 MG TOTAL) BY MOUTH EVERY 8 (EIGHT) HOURS AS NEEDED. 11/15/16   York Spaniel, MD  lidocaine (XYLOCAINE) 2 % solution Use as directed 15 mLs in the mouth or throat as needed for mouth pain. 01/27/18   Donavon Kimrey, [place], PA-C  magic mouthwash w/lidocaine SOLN Take 5 mLs by mouth 3 (three) times daily as needed for mouth pain. Patient not taking: Reported on 01/27/2018 02/15/17   Dietrich Pates, PA-C  meclizine (ANTIVERT) 25 MG tablet 1/2-1  pill up to 3 times daily for motion sickness/dizziness Patient not taking: Reported on 01/27/2018 11/11/16   Quentin Mulling, PA-C  mometasone (NASONEX) 50 MCG/ACT nasal spray Place 2 sprays into the nose daily. Patient not taking: Reported on 01/27/2018 08/11/16 08/11/17  Barrington Ellison 150-35 MCG/24HR transdermal patch  06/01/16   [provider]    Family History Family History  Problem Relation Age of Onset  . Heart disease Mother   . Hypertension Mother   . Diabetes Mother     Social History Social History   Tobacco Use  . Smoking status: Never Smoker  . Smokeless tobacco: Never Used  Substance Use Topics  . Alcohol use: No  . Drug use: No     Allergies   Patient has no known allergies.   Review of Systems Review of Systems  Constitutional: Positive for chills, fatigue and fever (subjective).    HENT: Positive for ear pain and sore throat. Negative for congestion, rhinorrhea, sinus pressure, sinus pain and sneezing.   Eyes: Negative for itching.  Respiratory: Positive for cough. Negative for shortness of breath.   Cardiovascular: Negative for chest pain.  Gastrointestinal: Negative for abdominal pain, constipation, diarrhea, nausea and vomiting.  Genitourinary: Negative for difficulty urinating and dysuria.  Musculoskeletal: Positive for myalgias ("body aches").  Skin: Negative for rash.     Physical Exam Triage Vital Signs ED Triage Vitals [01/27/18 1106]  Enc Vitals Group     BP 124/79     Pulse Rate (!) 102     Resp 18     Temp 98.9 F (37.2 C)     Temp src      SpO2 100 %     Weight      Height      Head Circumference      Peak Flow      Pain Score      Pain Loc      Pain Edu?      Excl. in GC?    No data found.  Updated Vital Signs BP 124/79   Pulse (!) 102   Temp 98.9 F (37.2 C)   Resp 18   SpO2 100%   Physical Exam  Constitutional: She is oriented to person, place, and time. She appears well-developed and well-nourished.  Non-toxic appearance. She does not appear ill. No distress.  HENT:  Head: Normocephalic and atraumatic.  Right Ear: Tympanic membrane and ear canal normal. No tenderness.  Left Ear: Tympanic membrane and ear canal normal. No tenderness.  Mouth/Throat: Uvula is midline. Uvula swelling present. Posterior oropharyngeal erythema present. Tonsils are 3+ on the right. Tonsils are 3+ on the left. Tonsillar exudate.  Tonsils erythematous  Eyes: Pupils are equal, round, and reactive to light. EOM are normal.  Neck: Neck supple.  Cardiovascular: Normal rate, regular rhythm and normal heart sounds. Exam reveals no gallop and no friction rub.  No murmur heard. Radial pulse 2+ bilaterally    Pulmonary/Chest: Effort normal and breath sounds normal. No stridor. No respiratory distress. She has no wheezes. She has no rhonchi. She has no  rales.  Abdominal: Soft. Bowel sounds are normal. There is no tenderness. There is no guarding.  Lymphadenopathy:    She has no cervical adenopathy.  Neurological: She is alert and oriented to person, place, and time.  Skin: Skin is warm and dry. Capillary refill takes less than 2 seconds.  Psychiatric: She has a normal mood and affect. Her behavior is normal.  UC Treatments / Results  Labs (all labs ordered are listed, but only abnormal results are displayed) Labs Reviewed  CULTURE, GROUP A STREP [facility])  POCT RAPID STREP A  POCT INFECTIOUS MONO SCREEN    EKG None  Radiology No results found.  Procedures Procedures (including critical care time)  Medications Ordered in UC Medications - No data to display  Initial Impression / Assessment and Plan / UC Course  I have reviewed the triage vital signs and the nursing notes.  Pertinent labs & imaging results that were available during my care of the patient were reviewed by me and considered in my medical decision making (see chart for details).    Patient hx, symptoms consistent with viral infection.  PE revealed 3+ tonsillar enlargement that were erythematous with white exudates which is more consistent with a bacterial infection.  Strep test was negative.  Culture sent.  Mono was negative.  Patient will continue symptomatic treatment.  Prescribed viscous lidocaine as needed for symptomatic relief.  Printed a prescription for amoxicillin and instructed patient to fill prescription in 2 days if her symptoms are not improving or if culture comes back positive.  She will follow up with PCP is symptoms persists.  Return precautions given.   Final Clinical Impressions(s) / UC Diagnoses   Final diagnoses:  Sore throat  Tonsillitis     Discharge Instructions     Strep was negative.  We will send out for culture and notify you of the results Mono was negative Drink plenty of water and get rest Do salt water gargles, drink  warm liquids, eat/ drink cool liquids or foods such as popsicles Prescribed viscous lidocaine as needed for symptomatic relief Use OTC tylenol and/ or ibuprofen as needed for symptomatic relief Prescription for amoxicillin given.  Patient will fill medication if symptoms do not improve in 2 days Prescribed diflucan for empiric treatment of yeast infection following course of antibiotic Follow up with PCP if symptoms persist Return or go to ER if you have any new or worsening symptoms    ED Prescriptions    Medication Sig Dispense Auth. Provider   amoxicillin (AMOXIL) 500 MG capsule Take 2 capsules (1,000 mg total) by mouth 1 day or 1 dose for 10 doses. 20 capsule Jewell Haught, [place], PA-C   lidocaine (XYLOCAINE) 2 % solution Use as directed 15 mLs in the mouth or throat as needed for mouth pain. 100 mL Jusitn Salsgiver, [place], PA-C   fluconazole (DIFLUCAN) 200 MG tablet Take 1 tablet (200 mg total) by mouth daily for 2 days. Take 1 tablet by mouth and then wait 72 hours then take the second tablet by mouth 2 tablet Paden Senger, [place], PA-C     Controlled Substance Prescriptions [place] Controlled Substance Registry consulted? Not Applicable   Rennis Harding, [place] 01/27/18 1304

## 2018-01-29 LAB — CULTURE, GROUP A STREP (THRC)

## 2018-01-30 NOTE — Progress Notes (Signed)
Attempted to reach patient regarding positive strep test and to instruct to start antibiotic given at visit. No answer and voicemail not available.  Message sent to MyChart.

## 2018-07-04 ENCOUNTER — Ambulatory Visit (HOSPITAL_COMMUNITY)
Admission: EM | Admit: 2018-07-04 | Discharge: 2018-07-04 | Disposition: A | Discharge status: Home / Self Care | Payer: Self-pay | Attending: Family Medicine | Admitting: Family Medicine

## 2018-07-04 ENCOUNTER — Encounter (HOSPITAL_COMMUNITY): Payer: Self-pay | Admitting: Family Medicine

## 2018-07-04 DIAGNOSIS — G8929 Other chronic pain: Secondary | ICD-10-CM

## 2018-07-04 DIAGNOSIS — M545 Low back pain: Secondary | ICD-10-CM

## 2018-07-04 MED ORDER — PREDNISONE 20 MG PO TABS: ORAL_TABLET | ORAL | 0 refills | Status: DC

## 2018-07-04 NOTE — ED Provider Notes (Signed)
[facility]    CSN: [id number] Arrival date & time: 07/04/18  1352     History   Chief Complaint No chief complaint on file.   HPI Patricia Boone is a 42 y.o. female.   Is a 42 year old woman who presents for evaluation of back pain.  She works at the [employer] and also works at [employer].  She cannot recall any particular incident where her back might have gotten injured.  She did work yesterday and did not have any pain, but when she awoke this morning she started having midline lower lumbar aching.  Patient denies fever, urinary symptoms, abdominal pain, or radiating pain.  Patient has been having recurring low back pain over the last year.  Not noted any particular pattern to this discomfort.     Past Medical History:  Diagnosis Date  . Carpal tunnel syndrome on both sides    Has brace, has seen Dr. Eulah Pont  . Carpal tunnel syndrome, bilateral 07/05/2016  . Common migraine with intractable migraine 06/22/2016  . GERD (gastroesophageal reflux disease)   . Prediabetes   . Trigger finger   . Vertigo     Patient Active Problem List   Diagnosis Date Noted  . Carpal tunnel syndrome, bilateral 07/05/2016  . Paresthesia 06/22/2016  . Common migraine with intractable migraine 06/22/2016  . Prediabetes   . GERD (gastroesophageal reflux disease)   . Obesity     Past Surgical History:  Procedure Laterality Date  . BREAST REDUCTION SURGERY Bilateral    Age 64  . CARPAL TUNNEL RELEASE Right 11/23/2016   Procedure: RIGHT CARPAL TUNNEL RELEASE;  Surgeon: Cindee Salt, MD;  Location: [facility];  Service: Orthopedics;  Laterality: Right;  . TRIGGER FINGER RELEASE Right 11/23/2016   Procedure: RELEASE TRIGGER FINGER/A-1 PULLEY RIGHT RING FINGER;  Surgeon: Cindee Salt, MD;  Location: [facility];  Service: Orthopedics;  Laterality: Right;    OB History    Gravida  1   Para      Term      Preterm      AB      Living        SAB     TAB      Ectopic      Multiple      Live Births               Home Medications    Prior to Admission medications   Medication Sig Start Date End Date Taking? Authorizing Provider  ibuprofen (ADVIL,MOTRIN) 800 MG tablet TAKE 1 TABLET (800 MG TOTAL) BY MOUTH EVERY 8 (EIGHT) HOURS AS NEEDED. 11/15/16   York Spaniel, MD  predniSONE (DELTASONE) 20 MG tablet Two daily with food 07/04/18   Elvina Sidle, MD  [facility] 150-35 MCG/24HR transdermal patch  06/01/16   [provider]    Family History Family History  Problem Relation Age of Onset  . Heart disease Mother   . Hypertension Mother   . Diabetes Mother     Social History Social History   Tobacco Use  . Smoking status: Never Smoker  . Smokeless tobacco: Never Used  Substance Use Topics  . Alcohol use: No  . Drug use: No     Allergies   Patient has no known allergies.   Review of Systems Review of Systems  Musculoskeletal: Positive for back pain.  All other systems reviewed and are negative.    Physical Exam Triage Vital Signs ED Triage  Vitals [07/04/18 1449]  Enc Vitals Group     BP 115/78     Pulse Rate 100     Resp 18     Temp 98.2 F (36.8 C)     Temp Source Oral     SpO2 100 %     Weight      Height      Head Circumference      Peak Flow      Pain Score      Pain Loc      Pain Edu?      Excl. in GC?    No data found.  Updated Vital Signs BP 115/78 (BP Location: Left Arm)   Pulse 100   Temp 98.2 F (36.8 C) (Oral)   Resp 18   SpO2 100%    Physical Exam  Constitutional: She is oriented to person, place, and time. She appears well-developed and well-nourished.  HENT:  Right Ear: External ear normal.  Left Ear: External ear normal.  Mouth/Throat: Oropharynx is clear and moist.  Eyes: Pupils are equal, round, and reactive to light. Conjunctivae are normal.  Neck: Normal range of motion. Neck supple.  Pulmonary/Chest: Effort normal.  Abdominal: Soft.    Musculoskeletal: She exhibits tenderness. She exhibits no deformity.  Patient has weakly positive straight leg raising signs on each side when seated.  There is some mild tenderness over L5 with palpation.  Neurological: She is alert and oriented to person, place, and time. No sensory deficit. Coordination normal.  Skin: Skin is warm and dry.  Psychiatric: She has a normal mood and affect.  Nursing note and vitals reviewed.    UC Treatments / Results  Labs (all labs ordered are listed, but only abnormal results are displayed) Labs Reviewed - No data to display  EKG None  Radiology No results found.  Procedures Procedures (including critical care time)  Medications Ordered in UC Medications - No data to display  Initial Impression / Assessment and Plan / UC Course  I have reviewed the triage vital signs and the nursing notes.  Pertinent labs & imaging results that were available during my care of the patient were reviewed by me and considered in my medical decision making (see chart for details).    Final Clinical Impressions(s) / UC Diagnoses   Final diagnoses:  Chronic midline low back pain without sciatica     Discharge Instructions     Follow-up with your personal physician next week.    ED Prescriptions    Medication Sig Dispense Auth. Provider   predniSONE (DELTASONE) 20 MG tablet Two daily with food 10 tablet Elvina Sidle, MD     Controlled Substance Prescriptions [place] Controlled Substance Registry consulted? Not Applicable   Elvina Sidle, MD 07/04/18 [phone]

## 2018-07-04 NOTE — Discharge Instructions (Addendum)
Follow-up with your personal physician next week.

## 2018-08-07 ENCOUNTER — Other Ambulatory Visit: Payer: Self-pay

## 2018-08-07 ENCOUNTER — Ambulatory Visit (HOSPITAL_COMMUNITY)
Admission: EM | Admit: 2018-08-07 | Discharge: 2018-08-07 | Disposition: A | Discharge status: Home / Self Care | Payer: Self-pay | Attending: Family Medicine | Admitting: Family Medicine

## 2018-08-07 ENCOUNTER — Encounter (HOSPITAL_COMMUNITY): Payer: Self-pay | Admitting: Emergency Medicine

## 2018-08-07 DIAGNOSIS — R42 Dizziness and giddiness: Secondary | ICD-10-CM | POA: Insufficient documentation

## 2018-08-07 DIAGNOSIS — K219 Gastro-esophageal reflux disease without esophagitis: Secondary | ICD-10-CM | POA: Insufficient documentation

## 2018-08-07 DIAGNOSIS — E669 Obesity, unspecified: Secondary | ICD-10-CM | POA: Insufficient documentation

## 2018-08-07 DIAGNOSIS — N390 Urinary tract infection, site not specified: Secondary | ICD-10-CM | POA: Insufficient documentation

## 2018-08-07 DIAGNOSIS — G5603 Carpal tunnel syndrome, bilateral upper limbs: Secondary | ICD-10-CM | POA: Insufficient documentation

## 2018-08-07 DIAGNOSIS — R7303 Prediabetes: Secondary | ICD-10-CM | POA: Insufficient documentation

## 2018-08-07 LAB — POCT URINALYSIS DIP (DEVICE)
Bilirubin Urine: NEGATIVE
Glucose, UA: NEGATIVE mg/dL
Nitrite: POSITIVE — AB
PH: 5.5 (ref 5.0–8.0)
PROTEIN: 100 mg/dL — AB
Specific Gravity, Urine: >=1.03 (ref 1.005–1.030)
UROBILINOGEN UA: 0.2 mg/dL (ref 0.0–1.0)

## 2018-08-07 LAB — POCT PREGNANCY, URINE: PREG TEST UR: NEGATIVE

## 2018-08-07 MED ORDER — FLUCONAZOLE 150 MG PO TABS: 150.0000 mg | ORAL_TABLET | Freq: Once | ORAL | 0 refills | Status: AC

## 2018-08-07 MED ORDER — NITROFURANTOIN MONOHYD MACRO 100 MG PO CAPS: 100.0000 mg | ORAL_CAPSULE | Freq: Two times a day (BID) | ORAL | 0 refills | Status: AC

## 2018-08-07 NOTE — ED Triage Notes (Signed)
Symptoms started Saturday.  Has been taking cranberry pills, drinking cranberry juice .  This morning urination was very painful and noticed blood in urine

## 2018-08-07 NOTE — ED Provider Notes (Signed)
[facility]    CSN: [id number] Arrival date & time: 08/07/18  0900     History   Chief Complaint Chief Complaint  Patient presents with  . Urinary Tract Infection    HPI Patricia Boone is a 42 y.o. female.   Patricia Boone presents with complaints of burning and frequency with urination which started two days ago. Noticed some blood when she wiped this morning. No fevers. No back pain. Some low abdominal pain. Denies vaginal symptoms. Has been taking cranberry/probiotic combination pill, had stopped briefly and then symptoms started. Has had UTI's in the past and this feels similar. Hx of gerd, prediabetes, vertigo, carpal tunnel.     ROS per HPI.      Past Medical History:  Diagnosis Date  . Carpal tunnel syndrome on both sides    Has brace, has seen Dr. Eulah Pont  . Carpal tunnel syndrome, bilateral 07/05/2016  . Common migraine with intractable migraine 06/22/2016  . GERD (gastroesophageal reflux disease)   . Prediabetes   . Trigger finger   . Vertigo     Patient Active Problem List   Diagnosis Date Noted  . Carpal tunnel syndrome, bilateral 07/05/2016  . Paresthesia 06/22/2016  . Common migraine with intractable migraine 06/22/2016  . Prediabetes   . GERD (gastroesophageal reflux disease)   . Obesity     Past Surgical History:  Procedure Laterality Date  . BREAST REDUCTION SURGERY Bilateral    Age 73  . CARPAL TUNNEL RELEASE Right 11/23/2016   Procedure: RIGHT CARPAL TUNNEL RELEASE;  Surgeon: Cindee Salt, MD;  Location: [facility];  Service: Orthopedics;  Laterality: Right;  . TRIGGER FINGER RELEASE Right 11/23/2016   Procedure: RELEASE TRIGGER FINGER/A-1 PULLEY RIGHT RING FINGER;  Surgeon: Cindee Salt, MD;  Location: [facility];  Service: Orthopedics;  Laterality: Right;    OB History    Gravida  1   Para      Term      Preterm      AB      Living        SAB      TAB      Ectopic      Multiple      Live  Births               Home Medications    Prior to Admission medications   Medication Sig Start Date End Date Taking? Authorizing Provider  fluconazole (DIFLUCAN) 150 MG tablet Take 1 tablet (150 mg total) by mouth once for 1 dose. 08/07/18 08/07/18  Georgetta Haber, NP  ibuprofen (ADVIL,MOTRIN) 800 MG tablet TAKE 1 TABLET (800 MG TOTAL) BY MOUTH EVERY 8 (EIGHT) HOURS AS NEEDED. 11/15/16   York Spaniel, MD  nitrofurantoin, macrocrystal-monohydrate, (MACROBID) 100 MG capsule Take 1 capsule (100 mg total) by mouth 2 (two) times daily for 5 days. 08/07/18 08/12/18  Georgetta Haber, NP    Family History Family History  Problem Relation Age of Onset  . Heart disease Mother   . Hypertension Mother   . Diabetes Mother     Social History Social History   Tobacco Use  . Smoking status: Never Smoker  . Smokeless tobacco: Never Used  Substance Use Topics  . Alcohol use: No  . Drug use: No     Allergies   Patient has no known allergies.   Review of Systems Review of Systems   Physical Exam Triage Vital Signs ED Triage Vitals  Enc  Vitals Group     BP 08/07/18 0927 118/78     Pulse Rate 08/07/18 0927 86     Resp 08/07/18 0927 18     Temp 08/07/18 0927 98.5 F (36.9 C)     Temp Source 08/07/18 0927 Oral     SpO2 08/07/18 0927 100 %     Weight --      Height --      Head Circumference --      Peak Flow --      Pain Score 08/07/18 0924 9     Pain Loc --      Pain Edu? --      Excl. in GC? --    No data found.  Updated Vital Signs BP 118/78 (BP Location: Right Arm)   Pulse 86   Temp 98.5 F (36.9 C) (Oral)   Resp 18   LMP 07/31/2018   SpO2 100%    Physical Exam  Constitutional: She is oriented to person, place, and time. She appears well-developed and well-nourished. No distress.  Cardiovascular: Normal rate, regular rhythm and normal heart sounds.  Pulmonary/Chest: Effort normal and breath sounds normal.  Abdominal: Soft. There is tenderness in the  suprapubic area. There is no rigidity, no rebound, no guarding and no CVA tenderness.  Very mild pressure to low abdomen on palpation  Neurological: She is alert and oriented to person, place, and time.  Skin: Skin is warm and dry.     UC Treatments / Results  Labs (all labs ordered are listed, but only abnormal results are displayed) Labs Reviewed  POCT URINALYSIS DIP (DEVICE) - Abnormal; Notable for the following components:      Result Value   Ketones, ur TRACE (*)    Hgb urine dipstick LARGE (*)    Protein, ur 100 (*)    Nitrite POSITIVE (*)    Leukocytes, UA MODERATE (*)    All other components within normal limits  URINE CULTURE  POCT PREGNANCY, URINE    EKG None  Radiology No results found.  Procedures Procedures (including critical care time)  Medications Ordered in UC Medications - No data to display  Initial Impression / Assessment and Plan / UC Course  I have reviewed the triage vital signs and the nursing notes.  Pertinent labs & imaging results that were available during my care of the patient were reviewed by me and considered in my medical decision making (see chart for details).     History and physical consistent with UTI. Culture obtained and pending. Course of macrobid provided. If symptoms worsen or do not improve in the next week to return to be seen or to follow up with PCP.  Patient verbalized understanding and agreeable to plan.   Final Clinical Impressions(s) / UC Diagnoses   Final diagnoses:  Lower urinary tract infectious disease     Discharge Instructions     Drink plenty of water to empty bladder regularly. Avoid alcohol and caffeine as these may irritate the bladder.  Complete course of antibiotics.  May take diflucan after antibiotics if develop yeast symptoms.  If symptoms worsen or do not improve in the next week to return to be seen or to follow up with your PCP.     ED Prescriptions    Medication Sig Dispense Auth.  Provider   nitrofurantoin, macrocrystal-monohydrate, (MACROBID) 100 MG capsule Take 1 capsule (100 mg total) by mouth 2 (two) times daily for 5 days. 10 capsule Georgetta Haber, NP  fluconazole (DIFLUCAN) 150 MG tablet Take 1 tablet (150 mg total) by mouth once for 1 dose. 1 tablet Georgetta Haber, NP     Controlled Substance Prescriptions [place] Controlled Substance Registry consulted? Not Applicable   Georgetta Haber, NP 08/07/18 1012

## 2018-08-07 NOTE — Discharge Instructions (Signed)
Drink plenty of water to empty bladder regularly. Avoid alcohol and caffeine as these may irritate the bladder.  Complete course of antibiotics.  May take diflucan after antibiotics if develop yeast symptoms.  If symptoms worsen or do not improve in the next week to return to be seen or to follow up with your PCP.

## 2018-08-10 LAB — URINE CULTURE: Culture: >=100000 — AB

## 2018-09-14 ENCOUNTER — Ambulatory Visit (HOSPITAL_COMMUNITY)
Admission: EM | Admit: 2018-09-14 | Discharge: 2018-09-14 | Disposition: A | Discharge status: Home / Self Care | Payer: BLUE CROSS/BLUE SHIELD | Attending: Family Medicine | Admitting: Family Medicine

## 2018-09-14 ENCOUNTER — Other Ambulatory Visit: Payer: Self-pay

## 2018-09-14 ENCOUNTER — Encounter (HOSPITAL_COMMUNITY): Payer: Self-pay

## 2018-09-14 DIAGNOSIS — J020 Streptococcal pharyngitis: Secondary | ICD-10-CM | POA: Diagnosis not present

## 2018-09-14 LAB — POCT RAPID STREP A: STREPTOCOCCUS, GROUP A SCREEN (DIRECT): POSITIVE — AB

## 2018-09-14 MED ORDER — PENICILLIN G BENZATHINE 1200000 UNIT/2ML IM SUSP
1.2000 10*6.[IU] | Freq: Once | INTRAMUSCULAR | Status: AC
  Administered 2018-09-14: 1.2 10*6.[IU] via INTRAMUSCULAR

## 2018-09-14 MED ORDER — PENICILLIN G BENZATHINE 1200000 UNIT/2ML IM SUSP
INTRAMUSCULAR | Status: AC
  Filled 2018-09-14: qty 2

## 2018-09-14 MED ORDER — FLUCONAZOLE 150 MG PO TABS: 150.0000 mg | ORAL_TABLET | Freq: Every day | ORAL | 0 refills | Status: DC

## 2018-09-14 NOTE — ED Provider Notes (Signed)
[facility]    CSN: [id number] Arrival date & time: 09/14/18  0901     History   Chief Complaint Chief Complaint  Patient presents with  . Sore Throat    HPI Patricia Boone is a 42 y.o. female.   Patient is a 42 year old female who presents with sore throat x2 days.  Her symptoms have been constant worsening.  She has been using Aleve with brief relief of symptoms.  She is having pain with swallowing and spitting up a lot of secretions.  She denies any associated fever, chills, body aches.  She has had some bilateral ear discomfort.  Denies any cough, congestion.  Her son was recently diagnosed with strep throat.   ROS per HPI      Past Medical History:  Diagnosis Date  . Carpal tunnel syndrome on both sides    Has brace, has seen Dr. Eulah PontMurphy  . Carpal tunnel syndrome, bilateral 07/05/2016  . Common migraine with intractable migraine 06/22/2016  . GERD (gastroesophageal reflux disease)   . Prediabetes   . Trigger finger   . Vertigo     Patient Active Problem List   Diagnosis Date Noted  . Carpal tunnel syndrome, bilateral 07/05/2016  . Paresthesia 06/22/2016  . Common migraine with intractable migraine 06/22/2016  . Prediabetes   . GERD (gastroesophageal reflux disease)   . Obesity     Past Surgical History:  Procedure Laterality Date  . BREAST REDUCTION SURGERY Bilateral    Age 42  . CARPAL TUNNEL RELEASE Right 11/23/2016   Procedure: RIGHT CARPAL TUNNEL RELEASE;  Surgeon: Cindee SaltGary Kuzma, MD;  Location: [facility];  Service: Orthopedics;  Laterality: Right;  . TRIGGER FINGER RELEASE Right 11/23/2016   Procedure: RELEASE TRIGGER FINGER/A-1 PULLEY RIGHT RING FINGER;  Surgeon: Cindee SaltGary Kuzma, MD;  Location: [facility];  Service: Orthopedics;  Laterality: Right;    OB History    Gravida  1   Para      Term      Preterm      AB      Living        SAB      TAB      Ectopic      Multiple      Live Births               Home Medications    Prior to Admission medications   Medication Sig Start Date End Date Taking? Authorizing Provider  fluconazole (DIFLUCAN) 150 MG tablet Take 1 tablet (150 mg total) by mouth daily. 09/14/18   Dahlia ByesBast, Deztinee Lohmeyer A, NP  ibuprofen (ADVIL,MOTRIN) 800 MG tablet TAKE 1 TABLET (800 MG TOTAL) BY MOUTH EVERY 8 (EIGHT) HOURS AS NEEDED. 11/15/16   York SpanielWillis, Charles K, MD    Family History Family History  Problem Relation Age of Onset  . Heart disease Mother   . Hypertension Mother   . Diabetes Mother     Social History Social History   Tobacco Use  . Smoking status: Never Smoker  . Smokeless tobacco: Never Used  Substance Use Topics  . Alcohol use: No  . Drug use: No     Allergies   Patient has no known allergies.   Review of Systems Review of Systems   Physical Exam Triage Vital Signs ED Triage Vitals  Enc Vitals Group     BP 09/14/18 0918 126/79     Pulse Rate 09/14/18 0918 87     Resp 09/14/18 0918  16     Temp 09/14/18 0918 98.3 F (36.8 C)     Temp Source 09/14/18 0918 Oral     SpO2 09/14/18 0918 100 %     Weight 09/14/18 0924 160 lb (72.6 kg)     Height --      Head Circumference --      Peak Flow --      Pain Score 09/14/18 0924 8     Pain Loc --      Pain Edu? --      Excl. in GC? --    No data found.  Updated Vital Signs BP 126/79 (BP Location: Left Arm)   Pulse 87   Temp 98.3 F (36.8 C) (Oral)   Resp 16   Wt 160 lb (72.6 kg)   LMP 08/28/2018   SpO2 100%   BMI 31.25 kg/m   Visual Acuity Right Eye Distance:   Left Eye Distance:   Bilateral Distance:    Right Eye Near:   Left Eye Near:    Bilateral Near:     Physical Exam Vitals signs and nursing note reviewed.  Constitutional:      Appearance: She is well-developed.  HENT:     Head: Normocephalic and atraumatic.     Right Ear: Tympanic membrane and ear canal normal.     Left Ear: Tympanic membrane and ear canal normal.     Nose: No congestion or rhinorrhea.      Mouth/Throat:     Mouth: Mucous membranes are moist.     Tonsils: Tonsillar exudate present. Swelling: 2+ on the right. 2+ on the left.  Eyes:     Conjunctiva/sclera: Conjunctivae normal.  Cardiovascular:     Rate and Rhythm: Normal rate and regular rhythm.  Pulmonary:     Effort: Pulmonary effort is normal.     Breath sounds: Normal breath sounds.  Skin:    General: Skin is warm and dry.  Neurological:     Mental Status: She is alert.  Psychiatric:        Mood and Affect: Mood normal.      UC Treatments / Results  Labs (all labs ordered are listed, but only abnormal results are displayed) Labs Reviewed  POCT RAPID STREP A - Abnormal; Notable for the following components:      Result Value   Streptococcus, Group A Screen (Direct) POSITIVE (*)    All other components within normal limits    EKG None  Radiology No results found.  Procedures Procedures (including critical care time)  Medications Ordered in UC Medications  penicillin g benzathine (BICILLIN LA) 1200000 UNIT/2ML injection 1.2 Million Units (has no administration in time range)    Initial Impression / Assessment and Plan / UC Course  I have reviewed the triage vital signs and the nursing notes.  Pertinent labs & imaging results that were available during my care of the patient were reviewed by me and considered in my medical decision making (see chart for details).     Rapid strep test positive we will go ahead and treat with penicillin here in clinic Warm salt water gargles Tylenol or ibuprofen as needed for pain and fever Follow up as needed for continued or worsening symptoms  Final Clinical Impressions(s) / UC Diagnoses   Final diagnoses:  None     Discharge Instructions     Your rapid strep test was positive Penicillin injection given here in clinic You can use warm salt water gargles Ibuprofen as needed  for pain Fluconazole sent to pharmacy for yeast infection    ED  Prescriptions    Medication Sig Dispense Auth. Provider   fluconazole (DIFLUCAN) 150 MG tablet Take 1 tablet (150 mg total) by mouth daily. 2 tablet Dahlia Byes A, NP     Controlled Substance Prescriptions [place] Controlled Substance Registry consulted? Not Applicable   Janace Aris, NP 09/14/18 1002

## 2018-09-14 NOTE — ED Triage Notes (Signed)
Pt cc sore throat x 2 days. 

## 2018-09-14 NOTE — Discharge Instructions (Addendum)
Your rapid strep test was positive Penicillin injection given here in clinic You can use warm salt water gargles Ibuprofen as needed for pain Fluconazole sent to pharmacy for yeast infection

## 2018-10-18 ENCOUNTER — Encounter: Payer: Self-pay | Admitting: Physician Assistant

## 2018-12-14 ENCOUNTER — Other Ambulatory Visit: Payer: Self-pay

## 2018-12-14 ENCOUNTER — Encounter (HOSPITAL_COMMUNITY): Payer: Self-pay

## 2018-12-14 ENCOUNTER — Ambulatory Visit (HOSPITAL_COMMUNITY)
Admission: EM | Admit: 2018-12-14 | Discharge: 2018-12-14 | Disposition: A | Discharge status: Home / Self Care | Payer: BLUE CROSS/BLUE SHIELD | Attending: Internal Medicine | Admitting: Internal Medicine

## 2018-12-14 DIAGNOSIS — J020 Streptococcal pharyngitis: Secondary | ICD-10-CM

## 2018-12-14 LAB — POCT RAPID STREP A: STREPTOCOCCUS, GROUP A SCREEN (DIRECT): NEGATIVE

## 2018-12-14 MED ORDER — PENICILLIN V POTASSIUM 500 MG PO TABS: 500.0000 mg | ORAL_TABLET | Freq: Two times a day (BID) | ORAL | 0 refills | Status: AC

## 2018-12-14 MED ORDER — FLUCONAZOLE 150 MG PO TABS: 150.0000 mg | ORAL_TABLET | ORAL | 0 refills | Status: AC

## 2018-12-14 NOTE — ED Triage Notes (Signed)
Patient presents to Urgent Care with complaints of sore throat since x2 days ago. Patient states she has recurrent strep throat, last had it in November of last year. No white patches visualized during triage, no fever.

## 2018-12-14 NOTE — ED Notes (Signed)
Patient verbalizes understanding of discharge instructions. Opportunity for questioning and answers were provided. Armband removed by staff, pt discharged from UC ambulatory.  

## 2018-12-14 NOTE — ED Provider Notes (Signed)
[facility]    CSN: [id number] Arrival date & time: 12/14/18  0865     History   Chief Complaint Chief Complaint  Patient presents with  . Sore Throat    HPI Patricia Boone is a 43 y.o. female with a history of  strep throat comes to urgent care with complaints of sore throat that started yesterday.  Sore throat onset was fairly rapid and is been persistent.  Patient denies any runny nose.  She has tried warm salt water gargles with no improvement.  She has odynophagia.  No nausea or vomiting.  No fever or chills. HPI  Past Medical History:  Diagnosis Date  . Carpal tunnel syndrome on both sides    Has brace, has seen Dr. Eulah Pont  . Carpal tunnel syndrome, bilateral 07/05/2016  . Common migraine with intractable migraine 06/22/2016  . GERD (gastroesophageal reflux disease)   . Prediabetes   . Trigger finger   . Vertigo     Patient Active Problem List   Diagnosis Date Noted  . Carpal tunnel syndrome, bilateral 07/05/2016  . Paresthesia 06/22/2016  . Common migraine with intractable migraine 06/22/2016  . Prediabetes   . GERD (gastroesophageal reflux disease)   . Obesity     Past Surgical History:  Procedure Laterality Date  . BREAST REDUCTION SURGERY Bilateral    Age 42  . CARPAL TUNNEL RELEASE Right 11/23/2016   Procedure: RIGHT CARPAL TUNNEL RELEASE;  Surgeon: Cindee Salt, MD;  Location: [facility];  Service: Orthopedics;  Laterality: Right;  . TRIGGER FINGER RELEASE Right 11/23/2016   Procedure: RELEASE TRIGGER FINGER/A-1 PULLEY RIGHT RING FINGER;  Surgeon: Cindee Salt, MD;  Location: [facility];  Service: Orthopedics;  Laterality: Right;    OB History    Gravida  1   Para      Term      Preterm      AB      Living        SAB      TAB      Ectopic      Multiple      Live Births               Home Medications    Prior to Admission medications   Medication Sig Start Date End Date Taking? Authorizing  Provider  fluconazole (DIFLUCAN) 150 MG tablet Take 1 tablet (150 mg total) by mouth 2 (two) times a week for 2 doses. 12/14/18 12/19/18  Merrilee Jansky, MD  ibuprofen (ADVIL,MOTRIN) 800 MG tablet TAKE 1 TABLET (800 MG TOTAL) BY MOUTH EVERY 8 (EIGHT) HOURS AS NEEDED. 11/15/16   York Spaniel, MD  penicillin v potassium (VEETID) 500 MG tablet Take 1 tablet (500 mg total) by mouth 2 (two) times daily for 10 days. 12/14/18 12/24/18  Merrilee Jansky, MD    Family History Family History  Problem Relation Age of Onset  . Heart disease Mother   . Hypertension Mother   . Diabetes Mother     Social History Social History   Tobacco Use  . Smoking status: Never Smoker  . Smokeless tobacco: Never Used  Substance Use Topics  . Alcohol use: No  . Drug use: No     Allergies   Patient has no known allergies.   Review of Systems Review of Systems  Constitutional: Negative for activity change, appetite change, chills, fatigue and fever.  HENT: Positive for sore throat. Negative for congestion, ear discharge, ear  pain, postnasal drip and rhinorrhea.   Respiratory: Negative for chest tightness, shortness of breath and wheezing.   Gastrointestinal: Negative for abdominal distention and abdominal pain.  Genitourinary: Negative for dysuria and urgency.  Musculoskeletal: Negative for arthralgias, joint swelling and myalgias.  Skin: Negative for rash and wound.  Neurological: Negative for dizziness, weakness and light-headedness.  All other systems reviewed and are negative.    Physical Exam Triage Vital Signs ED Triage Vitals  Enc Vitals Group     BP 12/14/18 0823 128/90     Pulse Rate 12/14/18 0823 87     Resp 12/14/18 0823 19     Temp 12/14/18 0823 97.9 F (36.6 C)     Temp Source 12/14/18 0823 Oral     SpO2 12/14/18 0823 100 %     Weight --      Height --      Head Circumference --      Peak Flow --      Pain Score 12/14/18 0821 8     Pain Loc --      Pain Edu? --       Excl. in GC? --    No data found.  Updated Vital Signs BP 128/90 (BP Location: Left Arm)   Pulse 87   Temp 97.9 F (36.6 C) (Oral)   Resp 19   SpO2 100%   Visual Acuity Right Eye Distance:   Left Eye Distance:   Bilateral Distance:    Right Eye Near:   Left Eye Near:    Bilateral Near:     Physical Exam Constitutional:      Appearance: She is not ill-appearing.  HENT:     Right Ear: Tympanic membrane normal.     Left Ear: Tympanic membrane normal.     Mouth/Throat:     Mouth: Mucous membranes are moist. No oral lesions.     Pharynx: Oropharyngeal exudate present. No uvula swelling.     Tonsils: 1+ on the right. 1+ on the left.  Eyes:     Extraocular Movements:     Left eye: Normal extraocular motion.  Cardiovascular:     Rate and Rhythm: Normal rate and regular rhythm.  Pulmonary:     Effort: Pulmonary effort is normal.     Breath sounds: Normal breath sounds.  Abdominal:     General: Bowel sounds are normal.     Palpations: Abdomen is soft.  Skin:    General: Skin is warm and dry.     Capillary Refill: Capillary refill takes less than 2 seconds.  Neurological:     Mental Status: She is alert.      UC Treatments / Results  Labs (all labs ordered are listed, but only abnormal results are displayed) Labs Reviewed  CULTURE, GROUP A STREP [facility])  POCT RAPID STREP A    EKG None  Radiology No results found.  Procedures Procedures (including critical care time)  Medications Ordered in UC Medications - No data to display  Initial Impression / Assessment and Plan / UC Course  I have reviewed the triage vital signs and the nursing notes.  Pertinent labs & imaging results that were available during my care of the patient were reviewed by me and considered in my medical decision making (see chart for details).     1.  Pharyngitis likely streptococcal: Penicillin V 500 mg twice daily for 10 days Warm salt water gargles Tylenol as needed for  fever/pain Rapid strep, if negative throat cultures.  Final Clinical Impressions(s) / UC Diagnoses   Final diagnoses:  Strep throat   Discharge Instructions   None    ED Prescriptions    Medication Sig Dispense Auth. Provider   fluconazole (DIFLUCAN) 150 MG tablet Take 1 tablet (150 mg total) by mouth 2 (two) times a week for 2 doses. 2 tablet , Britta Mccreedy, MD   penicillin v potassium (VEETID) 500 MG tablet Take 1 tablet (500 mg total) by mouth 2 (two) times daily for 10 days. 20 tablet , Britta Mccreedy, MD     Controlled Substance Prescriptions [place] Controlled Substance Registry consulted? No   Merrilee Jansky, MD 12/14/18 1334

## 2018-12-15 LAB — CULTURE, GROUP A STREP (THRC)

## 2019-07-26 ENCOUNTER — Other Ambulatory Visit: Payer: Self-pay

## 2019-07-26 ENCOUNTER — Encounter (HOSPITAL_COMMUNITY): Payer: Self-pay

## 2019-07-26 ENCOUNTER — Ambulatory Visit (HOSPITAL_COMMUNITY)
Admission: EM | Admit: 2019-07-26 | Discharge: 2019-07-26 | Disposition: A | Discharge status: Home / Self Care | Payer: BC Managed Care – PPO | Attending: Emergency Medicine | Admitting: Emergency Medicine

## 2019-07-26 DIAGNOSIS — Z20828 Contact with and (suspected) exposure to other viral communicable diseases: Secondary | ICD-10-CM | POA: Diagnosis not present

## 2019-07-26 DIAGNOSIS — J029 Acute pharyngitis, unspecified: Secondary | ICD-10-CM | POA: Insufficient documentation

## 2019-07-26 DIAGNOSIS — R7303 Prediabetes: Secondary | ICD-10-CM | POA: Diagnosis not present

## 2019-07-26 DIAGNOSIS — E669 Obesity, unspecified: Secondary | ICD-10-CM | POA: Insufficient documentation

## 2019-07-26 DIAGNOSIS — R509 Fever, unspecified: Secondary | ICD-10-CM | POA: Diagnosis not present

## 2019-07-26 DIAGNOSIS — R519 Headache, unspecified: Secondary | ICD-10-CM | POA: Insufficient documentation

## 2019-07-26 DIAGNOSIS — Z833 Family history of diabetes mellitus: Secondary | ICD-10-CM | POA: Diagnosis not present

## 2019-07-26 LAB — POCT RAPID STREP A: Streptococcus, Group A Screen (Direct): NEGATIVE

## 2019-07-26 MED ORDER — IBUPROFEN 800 MG PO TABS: 800.0000 mg | ORAL_TABLET | Freq: Three times a day (TID) | ORAL | 0 refills | Status: DC | PRN

## 2019-07-26 NOTE — Discharge Instructions (Signed)
Take the ibuprofen as directed.    Your rapid strep test is negative.  A throat culture is pending; we will call you if it is positive requiring treatment.    Your COVID test is pending.  You should self quarantine until your test result is back and is negative.    Go to the emergency department if you develop high fever, shortness of breath, severe diarrhea, or other concerning symptoms.

## 2019-07-26 NOTE — ED Provider Notes (Signed)
[place]    CSN: [id number] Arrival date & time: 07/26/19  4680      History   Chief Complaint Chief Complaint  Patient presents with  . Sore Throat  . Headache  . Fever    HPI Patricia Boone is a 43 y.o. female.   Patient presents with sore throat, fever, and headache x1 day.  T-max 102 yesterday.  She denies cough, shortness of breath, vomiting, diarrhea, rash, or other symptoms.  Treatment attempted at home with ibuprofen with moderate relief.  The history is provided by the patient.    Past Medical History:  Diagnosis Date  . Carpal tunnel syndrome on both sides    Has brace, has seen Dr. Percell Miller  . Carpal tunnel syndrome, bilateral 07/05/2016  . Common migraine with intractable migraine 06/22/2016  . GERD (gastroesophageal reflux disease)   . Prediabetes   . Trigger finger   . Vertigo     Patient Active Problem List   Diagnosis Date Noted  . Carpal tunnel syndrome, bilateral 07/05/2016  . Paresthesia 06/22/2016  . Common migraine with intractable migraine 06/22/2016  . Prediabetes   . GERD (gastroesophageal reflux disease)   . Obesity     Past Surgical History:  Procedure Laterality Date  . BREAST REDUCTION SURGERY Bilateral    Age 60  . CARPAL TUNNEL RELEASE Right 11/23/2016   Procedure: RIGHT CARPAL TUNNEL RELEASE;  Surgeon: Daryll Brod, MD;  Location: [place];  Service: Orthopedics;  Laterality: Right;  . TRIGGER FINGER RELEASE Right 11/23/2016   Procedure: RELEASE TRIGGER FINGER/A-1 PULLEY RIGHT RING FINGER;  Surgeon: Daryll Brod, MD;  Location: [place];  Service: Orthopedics;  Laterality: Right;    OB History    Gravida  1   Para      Term      Preterm      AB      Living        SAB      TAB      Ectopic      Multiple      Live Births               Home Medications    Prior to Admission medications   Medication Sig Start Date End Date Taking? Authorizing Provider  ibuprofen  (ADVIL) 800 MG tablet Take 1 tablet (800 mg total) by mouth every 8 (eight) hours as needed. 07/26/19   Sharion Balloon, NP    Family History Family History  Problem Relation Age of Onset  . Heart disease Mother   . Hypertension Mother   . Diabetes Mother     Social History Social History   Tobacco Use  . Smoking status: Never Smoker  . Smokeless tobacco: Never Used  Substance Use Topics  . Alcohol use: No  . Drug use: No     Allergies   Patient has no known allergies.   Review of Systems Review of Systems  Constitutional: Positive for fever. Negative for chills.  HENT: Positive for sore throat. Negative for ear pain.   Eyes: Negative for pain and visual disturbance.  Respiratory: Negative for cough and shortness of breath.   Cardiovascular: Negative for chest pain and palpitations.  Gastrointestinal: Negative for abdominal pain and vomiting.  Genitourinary: Negative for dysuria and hematuria.  Musculoskeletal: Negative for arthralgias and back pain.  Skin: Negative for color change and rash.  Neurological: Positive for headaches. Negative for seizures and syncope.  All other  systems reviewed and are negative.    Physical Exam Triage Vital Signs ED Triage Vitals  Enc Vitals Group     BP      Pulse      Resp      Temp      Temp src      SpO2      Weight      Height      Head Circumference      Peak Flow      Pain Score      Pain Loc      Pain Edu?      Excl. in GC?    No data found.  Updated Vital Signs BP 128/81 (BP Location: Right Arm)   Pulse (!) 116   Temp (!) 100.5 F (38.1 C) (Oral)   Resp 18   LMP 07/21/2019   SpO2 99%   Visual Acuity Right Eye Distance:   Left Eye Distance:   Bilateral Distance:    Right Eye Near:   Left Eye Near:    Bilateral Near:     Physical Exam Vitals signs and nursing note reviewed.  Constitutional:      General: She is not in acute distress.    Appearance: She is well-developed.  HENT:     Head:  Normocephalic and atraumatic.     Right Ear: Tympanic membrane normal.     Left Ear: Tympanic membrane normal.     Nose: Nose normal.     Mouth/Throat:     Mouth: Mucous membranes are moist.     Pharynx: Posterior oropharyngeal erythema present. No oropharyngeal exudate.  Eyes:     Conjunctiva/sclera: Conjunctivae normal.  Neck:     Musculoskeletal: Neck supple.  Cardiovascular:     Rate and Rhythm: Regular rhythm. Tachycardia present.     Heart sounds: No murmur.  Pulmonary:     Effort: Pulmonary effort is normal. No respiratory distress.     Breath sounds: Normal breath sounds.  Abdominal:     General: Bowel sounds are normal.     Palpations: Abdomen is soft.     Tenderness: There is no abdominal tenderness. There is no guarding or rebound.  Skin:    General: Skin is warm and dry.     Findings: No rash.  Neurological:     Mental Status: She is alert.      UC Treatments / Results  Labs (all labs ordered are listed, but only abnormal results are displayed) Labs Reviewed  NOVEL CORONAVIRUS, NAA (HOSP ORDER, SEND-OUT TO REF LAB; TAT 18-24 HRS)  CULTURE, GROUP A STREP [facility]  POCT RAPID STREP A    EKG   Radiology No results found.  Procedures Procedures (including critical care time)  Medications Ordered in UC Medications - No data to display  Initial Impression / Assessment and Plan / UC Course  I have reviewed the triage vital signs and the nursing notes.  Pertinent labs & imaging results that were available during my care of the patient were reviewed by me and considered in my medical decision making (see chart for details).    Pharyngitis.  Treating with prescribed ibuprofen.  Rapid strep negative.  Throat culture pending.  COVID test performed here.  Instructed patient to self quarantine until the test result is back.  Instructed patient to go to the emergency department if develops high fever, shortness of breath, severe diarrhea, or other concerning  symptoms.  Patient agrees with plan of care.  Final Clinical Impressions(s) / UC Diagnoses   Final diagnoses:  Pharyngitis, unspecified etiology     Discharge Instructions     Take the ibuprofen as directed.    Your rapid strep test is negative.  A throat culture is pending; we will call you if it is positive requiring treatment.    Your COVID test is pending.  You should self quarantine until your test result is back and is negative.    Go to the emergency department if you develop high fever, shortness of breath, severe diarrhea, or other concerning symptoms.        ED Prescriptions    Medication Sig Dispense Auth. Provider   ibuprofen (ADVIL) 800 MG tablet Take 1 tablet (800 mg total) by mouth every 8 (eight) hours as needed. 21 tablet Mickie Bail, NP     PDMP not reviewed this encounter.   Mickie Bail, NP 07/26/19 1024

## 2019-07-26 NOTE — ED Triage Notes (Signed)
Pt presents with sore throat, chills, fever, and headache since yesterday.

## 2019-07-28 LAB — NOVEL CORONAVIRUS, NAA (HOSP ORDER, SEND-OUT TO REF LAB; TAT 18-24 HRS): SARS-CoV-2, NAA: NOT DETECTED

## 2019-07-29 LAB — CULTURE, GROUP A STREP (THRC)

## 2019-07-31 ENCOUNTER — Telehealth (HOSPITAL_COMMUNITY): Payer: Self-pay | Admitting: Emergency Medicine

## 2019-07-31 NOTE — Telephone Encounter (Signed)
Covid Test Negative, patient notified in [place].  Strep shows not group A, attempted to contact patient to see how she was feeling, no answer. Sent mychart comment .

## 2020-04-11 ENCOUNTER — Other Ambulatory Visit: Payer: Self-pay

## 2020-04-11 ENCOUNTER — Ambulatory Visit (HOSPITAL_COMMUNITY)
Admission: EM | Admit: 2020-04-11 | Discharge: 2020-04-11 | Disposition: A | Discharge status: Home / Self Care | Payer: BC Managed Care – PPO | Attending: Urgent Care | Admitting: Urgent Care

## 2020-04-11 ENCOUNTER — Encounter (HOSPITAL_COMMUNITY): Payer: Self-pay | Admitting: Emergency Medicine

## 2020-04-11 DIAGNOSIS — Z20822 Contact with and (suspected) exposure to covid-19: Secondary | ICD-10-CM | POA: Diagnosis not present

## 2020-04-11 NOTE — ED Provider Notes (Signed)
[facility]   MRN: [id number] DOB: 16-Oct-1975  Subjective:   Patricia Boone is a 44 y.o. female presenting for COVID-19 testing.  Patient had close exposure to a person that turned out to be positive about 36 hours ago.  Both parties were wearing their mask.  Patient does not have Covid vaccination.  Smokes [employer]s.  No chronic conditions.  No current facility-administered medications for this encounter.  Current Outpatient Medications:  .  ibuprofen (ADVIL) 800 MG tablet, Take 1 tablet (800 mg total) by mouth every 8 (eight) hours as needed., Disp: 21 tablet, Rfl: 0   No Known Allergies  Past Medical History:  Diagnosis Date  . Carpal tunnel syndrome on both sides    Has brace, has seen Dr. Eulah Pont  . Carpal tunnel syndrome, bilateral 07/05/2016  . Common migraine with intractable migraine 06/22/2016  . GERD (gastroesophageal reflux disease)   . Prediabetes   . Trigger finger   . Vertigo      Past Surgical History:  Procedure Laterality Date  . BREAST REDUCTION SURGERY Bilateral    Age 48  . CARPAL TUNNEL RELEASE Right 11/23/2016   Procedure: RIGHT CARPAL TUNNEL RELEASE;  Surgeon: Cindee Salt, MD;  Location: [facility];  Service: Orthopedics;  Laterality: Right;  . TRIGGER FINGER RELEASE Right 11/23/2016   Procedure: RELEASE TRIGGER FINGER/A-1 PULLEY RIGHT RING FINGER;  Surgeon: Cindee Salt, MD;  Location: [facility];  Service: Orthopedics;  Laterality: Right;    Family History  Problem Relation Age of Onset  . Heart disease Mother   . Hypertension Mother   . Diabetes Mother     Social History   Tobacco Use  . Smoking status: Never Smoker  . Smokeless tobacco: Never Used  Substance Use Topics  . Alcohol use: No  . Drug use: No    Review of Systems  Constitutional: Negative for fever and malaise/fatigue.  HENT: Negative for congestion, ear pain, sinus pain and sore throat.   Eyes: Negative for discharge and redness.    Respiratory: Negative for cough, hemoptysis, shortness of breath and wheezing.   Cardiovascular: Negative for chest pain.  Gastrointestinal: Negative for abdominal pain, diarrhea, nausea and vomiting.  Genitourinary: Negative for dysuria, flank pain and hematuria.  Musculoskeletal: Negative for myalgias.  Skin: Negative for rash.  Neurological: Negative for dizziness, weakness and headaches.  Psychiatric/Behavioral: Negative for depression and substance abuse.     Objective:   Vitals: BP (!) 103/93 (BP Location: Right Arm)   Pulse 96   Temp 98.4 F (36.9 C) (Oral)   Resp 18   SpO2 99%   Physical Exam Constitutional:      General: She is not in acute distress.    Appearance: Normal appearance. She is well-developed. She is not ill-appearing, toxic-appearing or diaphoretic.  HENT:     Head: Normocephalic and atraumatic.     Nose: Nose normal.     Mouth/Throat:     Mouth: Mucous membranes are moist.  Eyes:     Extraocular Movements: Extraocular movements intact.     Pupils: Pupils are equal, round, and reactive to light.  Cardiovascular:     Rate and Rhythm: Normal rate and regular rhythm.     Pulses: Normal pulses.     Heart sounds: Normal heart sounds. No murmur heard.  No friction rub. No gallop.   Pulmonary:     Effort: Pulmonary effort is normal. No respiratory distress.     Breath sounds: Normal breath  sounds. No stridor. No wheezing, rhonchi or rales.  Skin:    General: Skin is warm and dry.     Findings: No rash.  Neurological:     Mental Status: She is alert and oriented to person, place, and time.  Psychiatric:        Mood and Affect: Mood normal.        Behavior: Behavior normal.        Thought Content: Thought content normal.      Assessment and Plan :   PDMP not reviewed this encounter.  1. Close exposure to COVID-19 virus     Counseled patient on nature of COVID-19 including modes of transmission, diagnostic testing, management and supportive  care.  Counseled on medications used for symptomatic relief. COVID 19 testing is pending. Counseled patient on potential for adverse effects with medications prescribed/recommended today, ER and return-to-clinic precautions discussed, patient verbalized understanding.     Wallis Bamberg, [place] 04/11/20 1818

## 2020-04-11 NOTE — ED Triage Notes (Signed)
Pt here for possible covid exposure yesterday per pt; pt denies any sx

## 2020-04-12 LAB — SARS CORONAVIRUS 2 (TAT 6-24 HRS): SARS Coronavirus 2: NEGATIVE

## 2020-05-19 ENCOUNTER — Other Ambulatory Visit: Payer: Self-pay

## 2020-05-19 ENCOUNTER — Ambulatory Visit (HOSPITAL_COMMUNITY)
Admission: RE | Admit: 2020-05-19 | Discharge: 2020-05-19 | Disposition: A | Discharge status: Home / Self Care | Payer: BC Managed Care – PPO | Source: Ambulatory Visit | Attending: Family Medicine | Admitting: Family Medicine

## 2020-05-19 ENCOUNTER — Encounter (HOSPITAL_COMMUNITY): Payer: Self-pay

## 2020-05-19 VITALS — BP 129/77 | HR 86 | Temp 100.2°F | Resp 17

## 2020-05-19 DIAGNOSIS — R509 Fever, unspecified: Secondary | ICD-10-CM | POA: Diagnosis not present

## 2020-05-19 DIAGNOSIS — R519 Headache, unspecified: Secondary | ICD-10-CM | POA: Diagnosis not present

## 2020-05-19 DIAGNOSIS — Z20822 Contact with and (suspected) exposure to covid-19: Secondary | ICD-10-CM | POA: Diagnosis not present

## 2020-05-19 DIAGNOSIS — J029 Acute pharyngitis, unspecified: Secondary | ICD-10-CM | POA: Insufficient documentation

## 2020-05-19 LAB — POCT RAPID STREP A, ED / UC: Streptococcus, Group A Screen (Direct): NEGATIVE

## 2020-05-19 LAB — SARS CORONAVIRUS 2 (TAT 6-24 HRS): SARS Coronavirus 2: NEGATIVE

## 2020-05-19 MED ORDER — HYDROCODONE-ACETAMINOPHEN 7.5-325 MG/15ML PO SOLN: 10.0000 mL | Freq: Four times a day (QID) | ORAL | 0 refills | Status: DC | PRN

## 2020-05-19 MED ORDER — FLUCONAZOLE 150 MG PO TABS: 150.0000 mg | ORAL_TABLET | Freq: Every day | ORAL | 0 refills | Status: DC

## 2020-05-19 MED ORDER — PENICILLIN V POTASSIUM 500 MG PO TABS: 500.0000 mg | ORAL_TABLET | Freq: Two times a day (BID) | ORAL | 0 refills | Status: AC

## 2020-05-19 NOTE — Discharge Instructions (Addendum)
Drink plenty of water Take the antibiotic 2 x a day for 10 days May stop if the culture is negative Take pain med if needed Take diflucan if needed

## 2020-05-19 NOTE — ED Triage Notes (Signed)
Pt presents with sore throat since Saturday.

## 2020-05-19 NOTE — ED Provider Notes (Signed)
[facility]    CSN: [id number] Arrival date & time: 05/19/20  1236      History   Chief Complaint Chief Complaint  Patient presents with  . Appointment  . Sore Throat    HPI Patricia Boone is a 44 y.o. female.   HPI  Patient states that she gets strep throat every year.  She feels that she has strep throat now.  She has a painful sore throat with fever chills and headache.  She has not taken any medication.  She is requesting a Bicillin shot.  She states that she has red tonsils with white exudate. No known exposure to strep No exposure to Covid. Has not had Covid vaccines.  Past Medical History:  Diagnosis Date  . Carpal tunnel syndrome on both sides    Has brace, has seen Dr. Eulah Pont  . Carpal tunnel syndrome, bilateral 07/05/2016  . Common migraine with intractable migraine 06/22/2016  . GERD (gastroesophageal reflux disease)   . Prediabetes   . Trigger finger   . Vertigo     Patient Active Problem List   Diagnosis Date Noted  . Carpal tunnel syndrome, bilateral 07/05/2016  . Paresthesia 06/22/2016  . Common migraine with intractable migraine 06/22/2016  . Prediabetes   . GERD (gastroesophageal reflux disease)   . Obesity     Past Surgical History:  Procedure Laterality Date  . BREAST REDUCTION SURGERY Bilateral    Age 13  . CARPAL TUNNEL RELEASE Right 11/23/2016   Procedure: RIGHT CARPAL TUNNEL RELEASE;  Surgeon: Cindee Salt, MD;  Location: [facility];  Service: Orthopedics;  Laterality: Right;  . TRIGGER FINGER RELEASE Right 11/23/2016   Procedure: RELEASE TRIGGER FINGER/A-1 PULLEY RIGHT RING FINGER;  Surgeon: Cindee Salt, MD;  Location: [facility];  Service: Orthopedics;  Laterality: Right;    OB History    Gravida  1   Para      Term      Preterm      AB      Living        SAB      TAB      Ectopic      Multiple      Live Births               Home Medications    Prior to Admission  medications   Medication Sig Start Date End Date Taking? Authorizing Provider  fluconazole (DIFLUCAN) 150 MG tablet Take 1 tablet (150 mg total) by mouth daily. Repeat in 1 week if needed 05/19/20   Eustace Moore, MD  HYDROcodone-acetaminophen (HYCET) 7.5-325 mg/15 ml solution Take 10 mLs by mouth 4 (four) times daily as needed for severe pain. 05/19/20 05/19/21  Eustace Moore, MD  ibuprofen (ADVIL) 800 MG tablet Take 1 tablet (800 mg total) by mouth every 8 (eight) hours as needed. 07/26/19   Mickie Bail, NP  penicillin v potassium (VEETID) 500 MG tablet Take 1 tablet (500 mg total) by mouth 2 (two) times daily for 10 days. 05/19/20 05/29/20  Eustace Moore, MD    Family History Family History  Problem Relation Age of Onset  . Heart disease Mother   . Hypertension Mother   . Diabetes Mother     Social History Social History   Tobacco Use  . Smoking status: Never Smoker  . Smokeless tobacco: Never Used  Substance Use Topics  . Alcohol use: No  . Drug use: No  Allergies   Patient has no known allergies.   Review of Systems Review of Systems See HPI  Physical Exam Triage Vital Signs ED Triage Vitals  Enc Vitals Group     BP 05/19/20 1325 129/77     Pulse Rate 05/19/20 1325 86     Resp 05/19/20 1325 17     Temp 05/19/20 1325 100.2 F (37.9 C)     Temp Source 05/19/20 1325 Oral     SpO2 05/19/20 1325 99 %     Weight --      Height --      Head Circumference --      Peak Flow --      Pain Score 05/19/20 1326 9     Pain Loc --      Pain Edu? --      Excl. in GC? --    No data found.  Updated Vital Signs BP 129/77 (BP Location: Left Arm)   Pulse 86   Temp 100.2 F (37.9 C) (Oral)   Resp 17   LMP 04/28/2020   SpO2 99% :     Physical Exam Constitutional:      General: She is not in acute distress.    Appearance: She is well-developed.     Comments: Muffled voice  HENT:     Head: Normocephalic and atraumatic.     Right Ear: Tympanic  membrane and ear canal normal.     Left Ear: Tympanic membrane and ear canal normal.     Nose: No congestion.     Mouth/Throat:     Mouth: Mucous membranes are moist.     Pharynx: Posterior oropharyngeal erythema present.     Tonsils: No tonsillar exudate or tonsillar abscesses. 2+ on the right. 2+ on the left.     Comments: Tonsils are swollen red, cryptic in appearance, no exudate tonsil stones are present Eyes:     Conjunctiva/sclera: Conjunctivae normal.     Pupils: Pupils are equal, round, and reactive to light.  Cardiovascular:     Rate and Rhythm: Normal rate.  Pulmonary:     Effort: Pulmonary effort is normal. No respiratory distress.  Abdominal:     General: There is no distension.     Palpations: Abdomen is soft.  Musculoskeletal:        General: Normal range of motion.     Cervical back: Normal range of motion.  Lymphadenopathy:     Cervical: Cervical adenopathy present.  Skin:    General: Skin is warm and dry.  Neurological:     Mental Status: She is alert.  Psychiatric:        Behavior: Behavior normal.      UC Treatments / Results  Labs (all labs ordered are listed, but only abnormal results are displayed) Labs Reviewed  CULTURE, GROUP A STREP ([facility])  SARS CORONAVIRUS 2 (TAT 6-24 HRS)  POCT RAPID STREP A, ED / UC   Strep test is negative    Radiology No results found.  Procedures Procedures (including critical care time)  Medications Ordered in UC Medications - No data to display  Initial Impression / Assessment and Plan / UC Course  I have reviewed the triage vital signs and the nursing notes.  Pertinent labs & imaging results that were available during my care of the patient were reviewed by me and considered in my medical decision making (see chart for details).     I explained to the patient that while the prednisone shot is  appropriate for people strep throat, there is no complaint someone with multiple classic strep test.  Ibuprofen oral  antibiotics take until the strep test is available. Final Clinical Impressions(s) / UC Diagnoses   Final diagnoses:  Pharyngitis, unspecified etiology     Discharge Instructions     Drink plenty of water Take the antibiotic 2 x a day for 10 days May stop if the culture is negative Take pain med if needed Take diflucan if needed      ED Prescriptions    Medication Sig Dispense Auth. Provider   penicillin v potassium (VEETID) 500 MG tablet Take 1 tablet (500 mg total) by mouth 2 (two) times daily for 10 days. 20 tablet Eustace Moore, MD   fluconazole (DIFLUCAN) 150 MG tablet Take 1 tablet (150 mg total) by mouth daily. Repeat in 1 week if needed 2 tablet Eustace Moore, MD   HYDROcodone-acetaminophen (HYCET) 7.5-325 mg/15 ml solution Take 10 mLs by mouth 4 (four) times daily as needed for severe pain. 80 mL Eustace Moore, MD     I have reviewed the PDMP during this encounter.   Eustace Moore, MD 05/19/20 [phone]

## 2020-05-21 LAB — CULTURE, GROUP A STREP (THRC)

## 2020-06-20 ENCOUNTER — Encounter (HOSPITAL_COMMUNITY): Payer: Self-pay

## 2020-06-20 ENCOUNTER — Other Ambulatory Visit: Payer: Self-pay

## 2020-06-20 ENCOUNTER — Ambulatory Visit (HOSPITAL_COMMUNITY)
Admission: EM | Admit: 2020-06-20 | Discharge: 2020-06-20 | Disposition: A | Discharge status: Home / Self Care | Payer: BC Managed Care – PPO

## 2020-06-20 DIAGNOSIS — Z3202 Encounter for pregnancy test, result negative: Secondary | ICD-10-CM | POA: Diagnosis not present

## 2020-06-20 DIAGNOSIS — M545 Low back pain, unspecified: Secondary | ICD-10-CM

## 2020-06-20 LAB — POC URINE PREG, ED: Preg Test, Ur: NEGATIVE

## 2020-06-20 MED ORDER — NAPROXEN 500 MG PO TABS: 500.0000 mg | ORAL_TABLET | Freq: Two times a day (BID) | ORAL | 0 refills | Status: AC

## 2020-06-20 MED ORDER — TIZANIDINE HCL 4 MG PO TABS: 4.0000 mg | ORAL_TABLET | Freq: Every day | ORAL | 0 refills | Status: AC

## 2020-06-20 NOTE — Discharge Instructions (Addendum)
Take the naproxen with food as prescribed for the next 3 to 4 days, then only as needed  Take the Zanaflex/tizanidine which is the muscle relaxer only at night as this will make you sleepy.  Do not drive, drink alcohol or operate machinery within 8 hours of taking  Follow-up with your primary care next week for further discussion of your chronic low back pains  You may also consider following up with the sports medicine group  If you develop severely worsening pain, numbness tingling, weakness or other concerning symptoms return or go to the emergency department

## 2020-06-20 NOTE — ED Provider Notes (Signed)
[facility]    CSN: [id number] Arrival date & time: 06/20/20  5732      History   Chief Complaint Chief Complaint  Patient presents with  . Back Pain    HPI Patricia Boone is a 44 y.o. female.   Patient with history of low back pain presents for evaluation of low back pain.  She reports she has had issues with on and off over the years with mild low back pain.  She does report that has been present on a low level for about a month.  She reports over the last week she has had developing low back pain that started as a low level pain but has become a little more intense.  She reports occasional sharp pangs of pain.  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  She reports yesterday had to unload a truck and she thinks this made it worse.  She denies any numbness, tingling or weakness in the legs.  She denies radiation of pain into the legs.  Denies pain urination, frequency or urgency.  Denies belly pain.  Denies difficulty controlling her bladder.  Denies any fevers.  Denies any changes in her bowel habits.  She has never had a trauma to the back.  She has tried Tylenol without much relief.  She is also tried heating pads and Biofreeze.  Reports similar episode last year when she received prednisone and this seemed to work.     Past Medical History:  Diagnosis Date  . Carpal tunnel syndrome on both sides    Has brace, has seen Dr. Eulah Pont  . Carpal tunnel syndrome, bilateral 07/05/2016  . Common migraine with intractable migraine 06/22/2016  . GERD (gastroesophageal reflux disease)   . Prediabetes   . Trigger finger   . Vertigo     Patient Active Problem List   Diagnosis Date Noted  . Carpal tunnel syndrome, bilateral 07/05/2016  . Paresthesia 06/22/2016  . Common migraine with intractable migraine 06/22/2016  . Prediabetes   . GERD (gastroesophageal reflux disease)   . Obesity     Past Surgical History:  Procedure  Laterality Date  . BREAST REDUCTION SURGERY Bilateral    Age 31  . CARPAL TUNNEL RELEASE Right 11/23/2016   Procedure: RIGHT CARPAL TUNNEL RELEASE;  Surgeon: Cindee Salt, MD;  Location: [facility];  Service: Orthopedics;  Laterality: Right;  . TRIGGER FINGER RELEASE Right 11/23/2016   Procedure: RELEASE TRIGGER FINGER/A-1 PULLEY RIGHT RING FINGER;  Surgeon: Cindee Salt, MD;  Location: [facility];  Service: Orthopedics;  Laterality: Right;    OB History    Gravida  1   Para      Term      Preterm      AB      Living        SAB      TAB      Ectopic      Multiple      Live Births               Home Medications    Prior to Admission medications   Medication Sig Start Date End Date Taking? Authorizing Provider  fluconazole (DIFLUCAN) 150 MG tablet Take 1 tablet (150 mg total) by mouth daily. Repeat in 1 week if needed 05/19/20   Eustace Moore, MD  HYDROcodone-acetaminophen (HYCET) 7.5-325 mg/15 ml solution Take 10 mLs by mouth 4 (  four) times daily as needed for severe pain. 05/19/20 05/19/21  Eustace Moore, MD  ibuprofen (ADVIL) 800 MG tablet Take 1 tablet (800 mg total) by mouth every 8 (eight) hours as needed. 07/26/19   Mickie Bail, NP  naproxen (NAPROSYN) 500 MG tablet Take 1 tablet (500 mg total) by mouth 2 (two) times daily for 10 days. 06/20/20 06/30/20  Renee Erb, Veryl Speak, PA-C  tiZANidine (ZANAFLEX) 4 MG tablet Take 1 tablet (4 mg total) by mouth at bedtime for 10 days. 06/20/20 06/30/20  Ryken Paschal, Veryl Speak, PA-C    Family History Family History  Problem Relation Age of Onset  . Heart disease Mother   . Hypertension Mother   . Diabetes Mother     Social History Social History   Tobacco Use  . Smoking status: Never Smoker  . Smokeless tobacco: Never Used  Substance Use Topics  . Alcohol use: No  . Drug use: No     Allergies   Patient has no known allergies.   Review of Systems Review of Systems   Physical  Exam Triage Vital Signs ED Triage Vitals [06/20/20 0842]  Enc Vitals Group     BP (!) 145/88     Pulse Rate 88     Resp 16     Temp 98.2 F (36.8 C)     Temp Source Oral     SpO2 100 %     Weight      Height      Head Circumference      Peak Flow      Pain Score 8     Pain Loc      Pain Edu?      Excl. in GC?    No data found.  Updated Vital Signs BP (!) 145/88 (BP Location: Right Arm)   Pulse 88   Temp 98.2 F (36.8 C) (Oral)   Resp 16   LMP 05/21/2020   SpO2 100%   Visual Acuity Right Eye Distance:   Left Eye Distance:   Bilateral Distance:    Right Eye Near:   Left Eye Near:    Bilateral Near:     Physical Exam Vitals and nursing note reviewed.  Constitutional:      General: She is not in acute distress.    Appearance: Normal appearance. She is well-developed. She is not ill-appearing.  HENT:     Head: Normocephalic and atraumatic.  Cardiovascular:     Rate and Rhythm: Normal rate.  Pulmonary:     Effort: Pulmonary effort is normal. No respiratory distress.  Musculoskeletal:     Cervical back: Neck supple.     Comments: Tenderness to palpation bilateral lower lumbar musculature.  Mild midline tenderness, however more significant in the lateral musculature.  There is no midline tenderness of the upper lumbar, thoracic or cervical region.  Patient has range of motion of the spinal,, however flexion is limited due to pain, full range of motion with extension and twisting.  Straight leg raise negative.  Patient has 5 out of 5 strength in the lower extremities.  Sensation is grossly intact.    Skin:    General: Skin is warm and dry.  Neurological:     General: No focal deficit present.     Mental Status: She is alert and oriented to person, place, and time.     Motor: No weakness.     Coordination: Coordination normal.     Gait: Gait normal.  Deep Tendon Reflexes: Reflexes normal.      UC Treatments / Results  Labs (all labs ordered are listed,  but only abnormal results are displayed) Labs Reviewed  POC URINE PREG, ED    EKG   Radiology No results found.  Procedures Procedures (including critical care time)  Medications Ordered in UC Medications - No data to display  Initial Impression / Assessment and Plan / UC Course  I have reviewed the triage vital signs and the nursing notes.  Pertinent labs & imaging results that were available during my care of the patient were reviewed by me and considered in my medical decision making (see chart for details).     #Low back pain Patient is a 44 year old with history of low back pain presenting with low back pain.  No red flags today, no trauma will defer imaging.  Neurologically well.  Will treat with NSAIDs and muscle relaxer.  Encouraged her to follow-up with her primary care to discuss and also get sports medicine follow-up option.  Stretching given.  Work note given.  Discussed red flags, return and emergency department precautions.  Patient verbalized agreement and understanding plan of care Final Clinical Impressions(s) / UC Diagnoses   Final diagnoses:  Acute bilateral low back pain without sciatica     Discharge Instructions     Take the naproxen with food as prescribed for the next 3 to 4 days, then only as needed  Take the Zanaflex/tizanidine which is the muscle relaxer only at night as this will make you sleepy.  Do not drive, drink alcohol or operate machinery within 8 hours of taking  Follow-up with your primary care next week for further discussion of your chronic low back pains  You may also consider following up with the sports medicine group  If you develop severely worsening pain, numbness tingling, weakness or other concerning symptoms return or go to the emergency department       ED Prescriptions    Medication Sig Dispense Auth. Provider   naproxen (NAPROSYN) 500 MG tablet Take 1 tablet (500 mg total) by mouth 2 (two) times daily for 10 days.  20 tablet Russie Gulledge, Veryl Speak, PA-C   tiZANidine (ZANAFLEX) 4 MG tablet Take 1 tablet (4 mg total) by mouth at bedtime for 10 days. 10 tablet Willie Plain, Veryl Speak, PA-C     PDMP not reviewed this encounter.   Hermelinda Medicus, PA-C 06/20/20 [phone]

## 2020-06-20 NOTE — ED Triage Notes (Signed)
Pt present back pain, symptom started over a week ago. Pt tried otc medication with no relief.

## 2020-12-01 ENCOUNTER — Encounter (HOSPITAL_COMMUNITY): Payer: Self-pay | Admitting: *Deleted

## 2020-12-01 ENCOUNTER — Ambulatory Visit (HOSPITAL_COMMUNITY)
Admission: EM | Admit: 2020-12-01 | Discharge: 2020-12-01 | Disposition: A | Discharge status: Home / Self Care | Payer: BC Managed Care – PPO | Attending: Medical Oncology | Admitting: Medical Oncology

## 2020-12-01 ENCOUNTER — Other Ambulatory Visit: Payer: Self-pay

## 2020-12-01 ENCOUNTER — Ambulatory Visit (INDEPENDENT_AMBULATORY_CARE_PROVIDER_SITE_OTHER): Payer: BC Managed Care – PPO

## 2020-12-01 DIAGNOSIS — R200 Anesthesia of skin: Secondary | ICD-10-CM

## 2020-12-01 DIAGNOSIS — M545 Low back pain, unspecified: Secondary | ICD-10-CM | POA: Diagnosis not present

## 2020-12-01 DIAGNOSIS — M5441 Lumbago with sciatica, right side: Secondary | ICD-10-CM

## 2020-12-01 LAB — CBG MONITORING, ED: Glucose-Capillary: 96 mg/dL (ref 70–99)

## 2020-12-01 IMAGING — DX DG LUMBAR SPINE COMPLETE 4+V
5 series · 5 of 5 positions shown · non-contrast
Comparison: None.

CLINICAL DATA: Right lower back pain, lower extremity numbness

EXAM:
LUMBAR SPINE - COMPLETE 4+ VIEW

[l-spine ap]
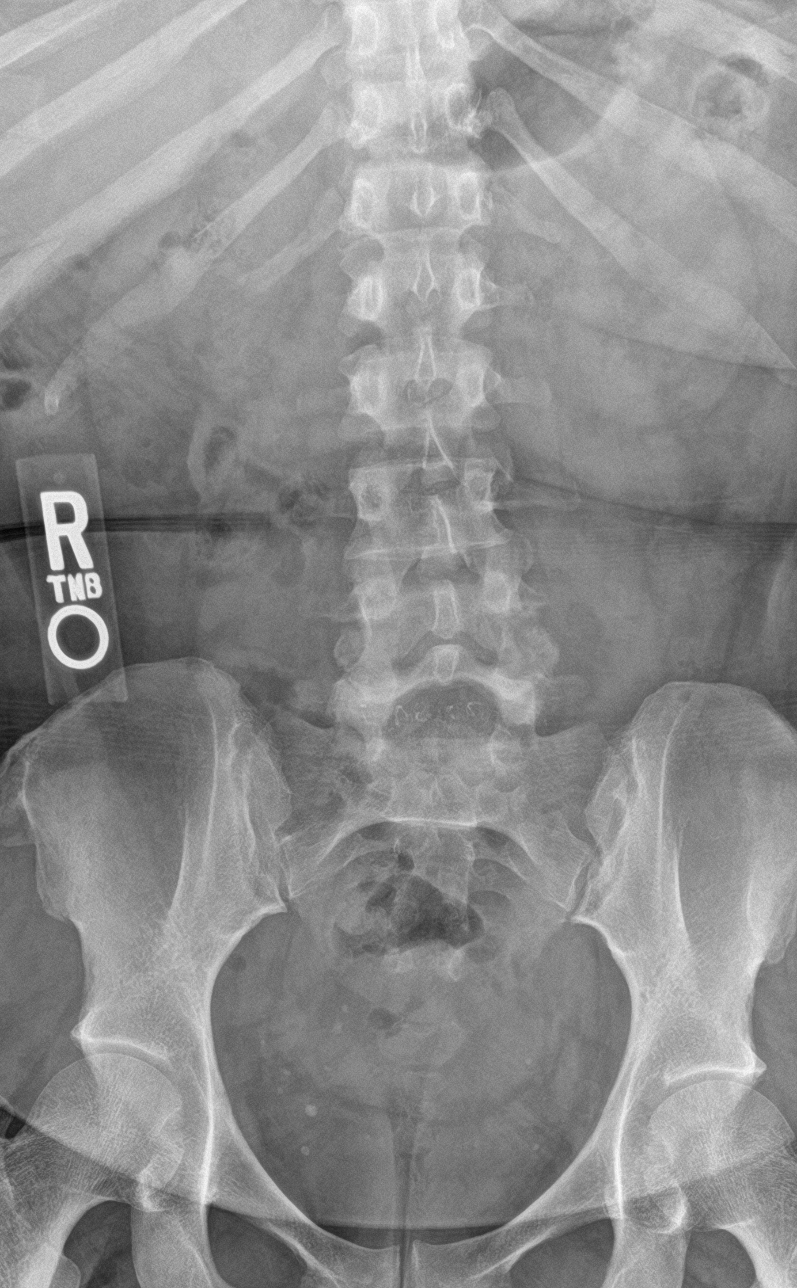

[l-spine obl (1 of 2)]
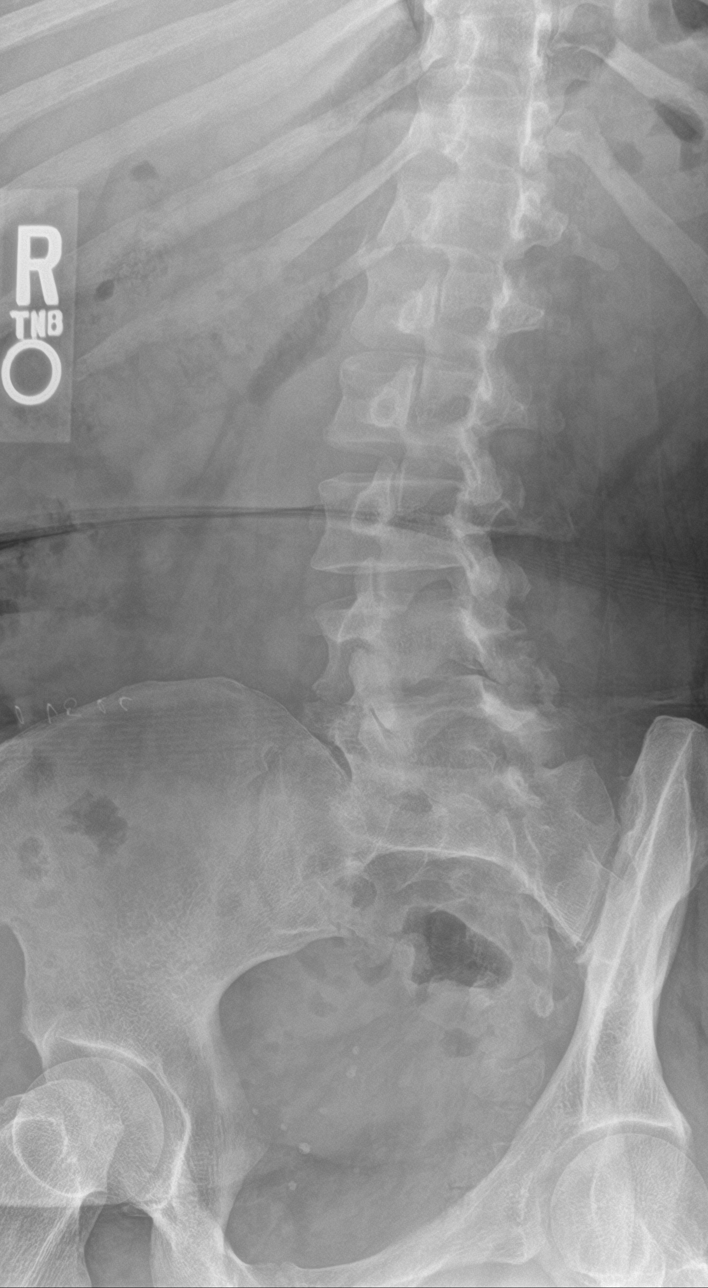

[l-spine lat]
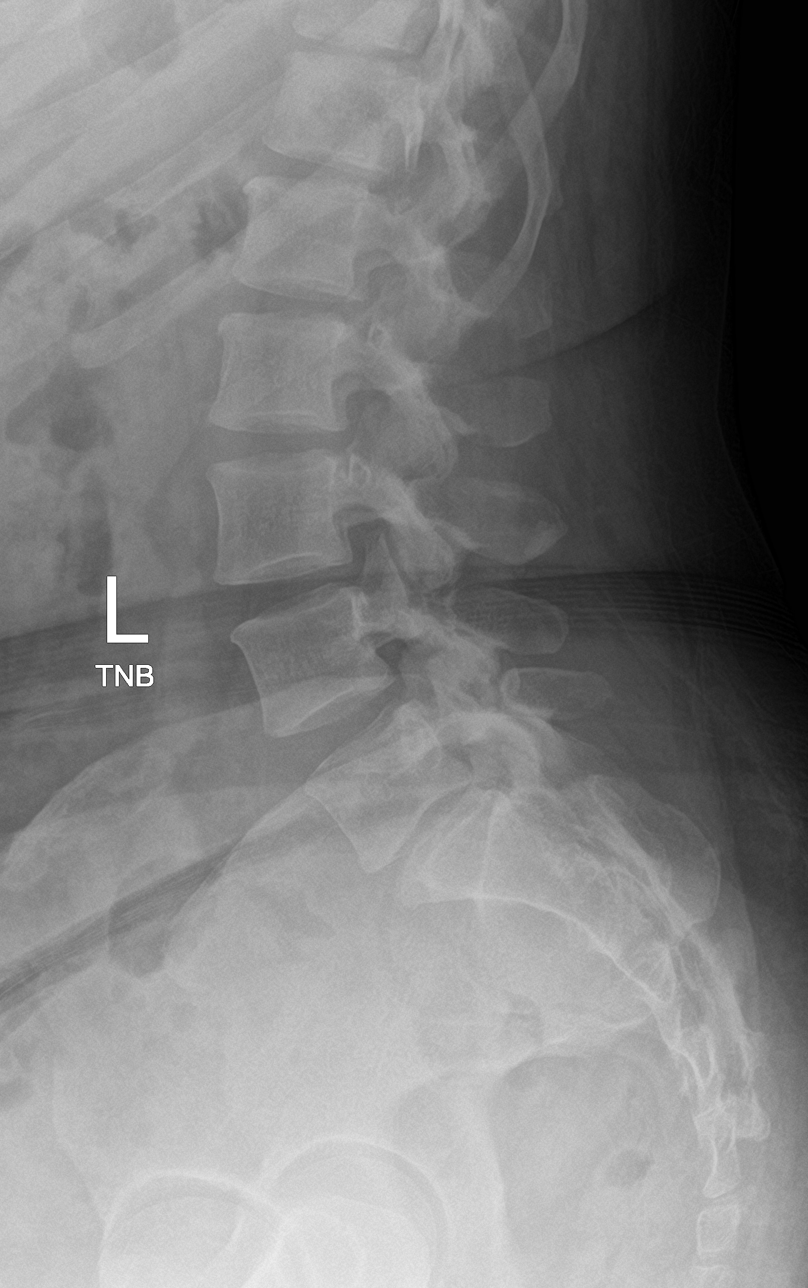

[l-spine spot]
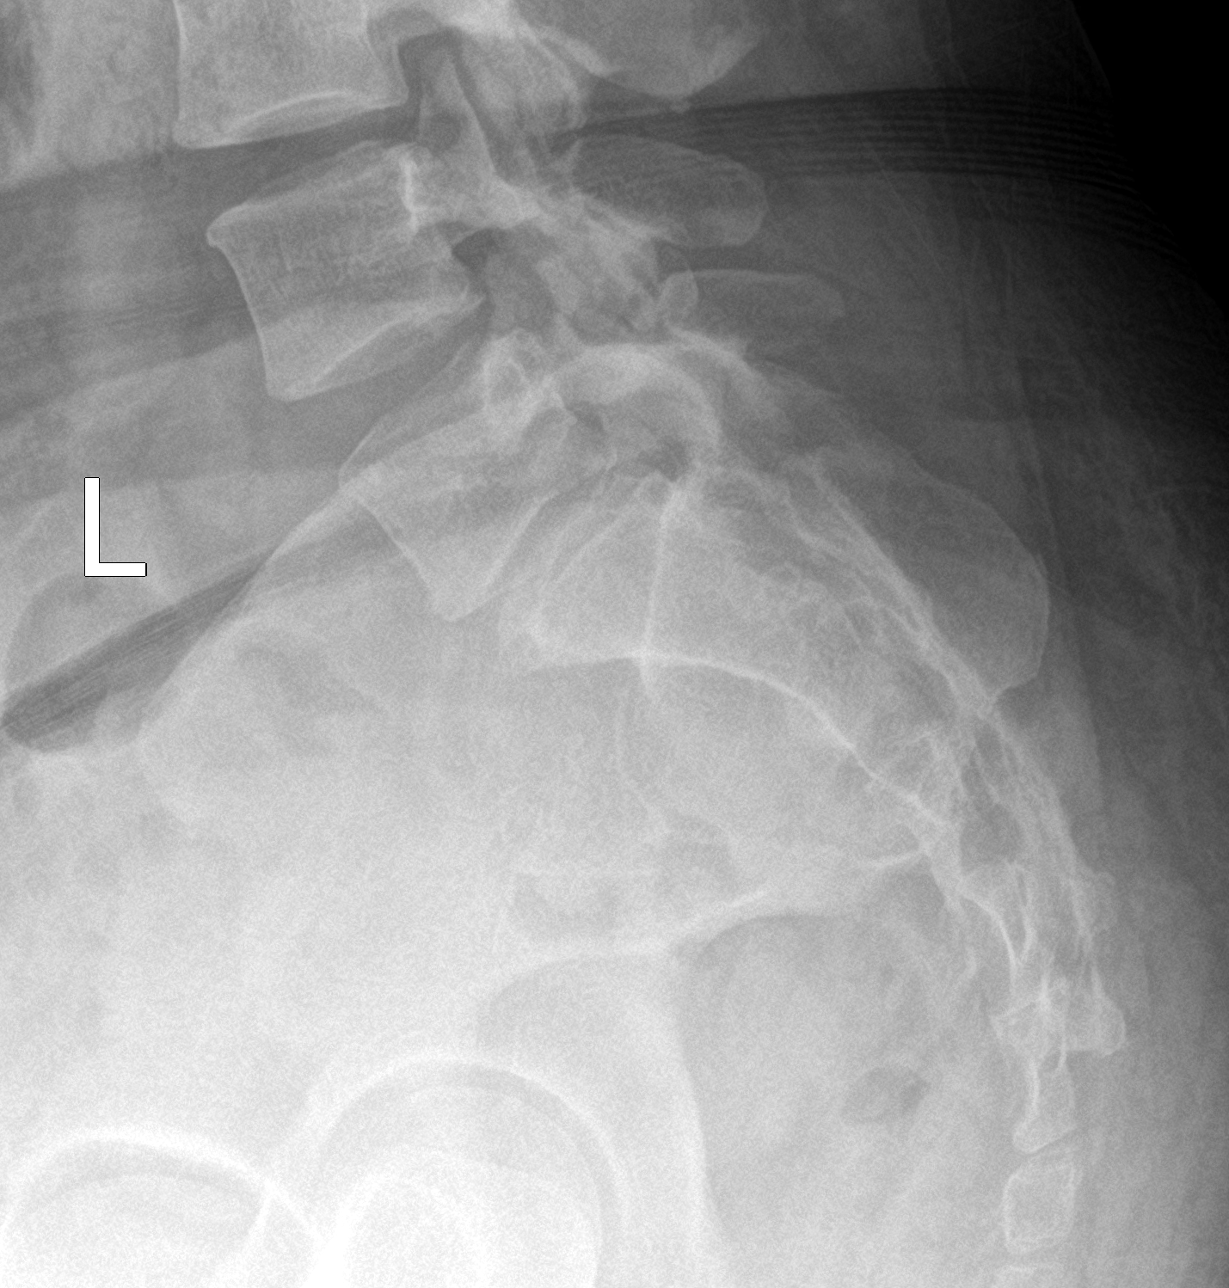

[l-spine obl (2 of 2)]
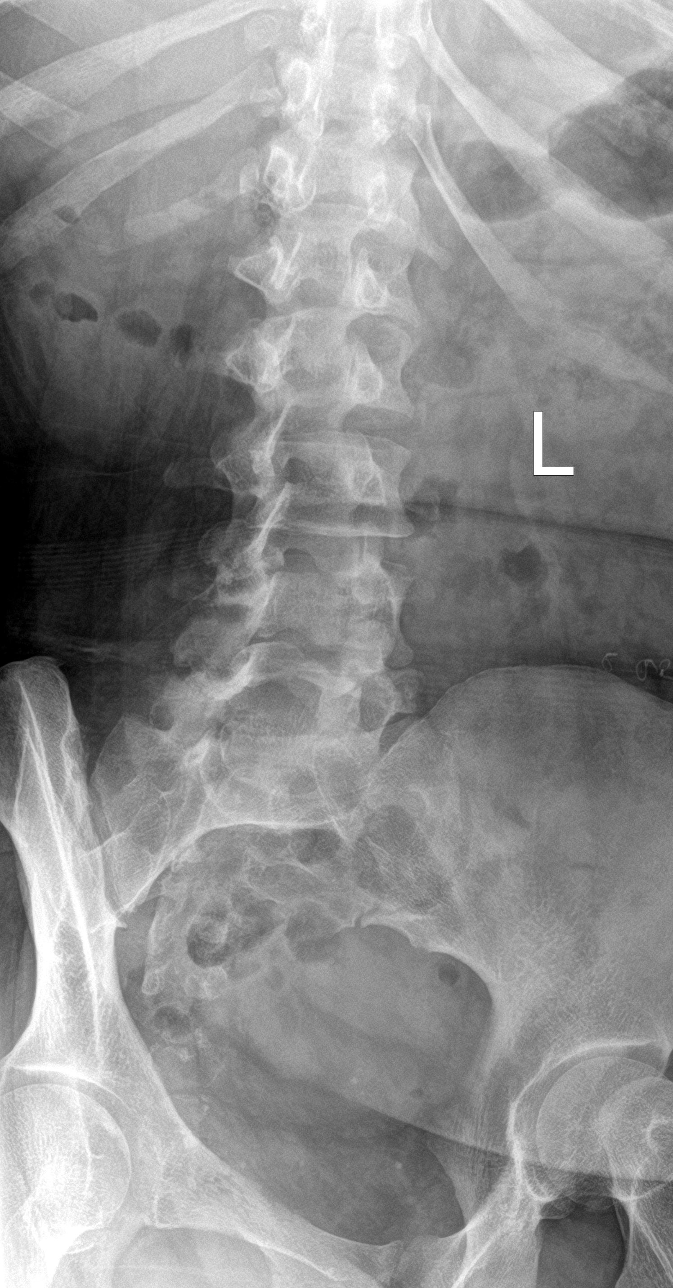

[5 of 5 positions shown; findings below may reference images not displayed]

FINDINGS: Frontal, bilateral oblique, lateral views of the lumbar spine are
obtained. There are 5 non-rib-bearing lumbar type vertebral bodies
in grossly normal alignment. There is mild spondylosis at L5-S1,
with prominent facet hypertrophy at L4-5 and L5-S1. No acute
fractures. Sacroiliac joints are normal.
IMPRESSION: 1. Spondylosis and facet hypertrophy at the lumbosacral junction. No
acute fracture.

## 2020-12-01 MED ORDER — PREDNISONE 10 MG (21) PO TBPK: ORAL_TABLET | Freq: Every day | ORAL | 0 refills | Status: DC

## 2020-12-01 NOTE — ED Triage Notes (Signed)
Pt reports pain on Rt side of leg and the numbness to RT leg and rt foot and Lt foot started this morning. Pt ambulatory to room with out assistance.

## 2020-12-01 NOTE — ED Provider Notes (Signed)
[facility]    CSN: [id number] Arrival date & time: 12/01/20  1420      History   Chief Complaint Chief Complaint  Patient presents with  . leg numbness  . foot numbness    RT/LT foot    HPI Sulema Braid is a 45 y.o. female.   HPI   Foot numbness: Pt reports that for the past day she has had a decreased sensation of her left leg. Now symptoms are starting on the right foot. She reports that originally symptoms occurred when she woke up and did not seem to progress in nature down or up the left leg. No known injury but does state that her left lower side has been painful for about 2 weeks. Feels muscular in nature. She has not tried anything for symptoms. No muscle weakness, skin color changes, or edema.    Past Medical History:  Diagnosis Date  . Carpal tunnel syndrome on both sides    Has brace, has seen Dr. Eulah Pont  . Carpal tunnel syndrome, bilateral 07/05/2016  . Common migraine with intractable migraine 06/22/2016  . GERD (gastroesophageal reflux disease)   . Prediabetes   . Trigger finger   . Vertigo     Patient Active Problem List   Diagnosis Date Noted  . Carpal tunnel syndrome, bilateral 07/05/2016  . Paresthesia 06/22/2016  . Common migraine with intractable migraine 06/22/2016  . Prediabetes   . GERD (gastroesophageal reflux disease)   . Obesity     Past Surgical History:  Procedure Laterality Date  . BREAST REDUCTION SURGERY Bilateral    Age 42  . CARPAL TUNNEL RELEASE Right 11/23/2016   Procedure: RIGHT CARPAL TUNNEL RELEASE;  Surgeon: Cindee Salt, MD;  Location: [facility];  Service: Orthopedics;  Laterality: Right;  . TRIGGER FINGER RELEASE Right 11/23/2016   Procedure: RELEASE TRIGGER FINGER/A-1 PULLEY RIGHT RING FINGER;  Surgeon: Cindee Salt, MD;  Location: [facility];  Service: Orthopedics;  Laterality: Right;    OB History    Gravida  1   Para      Term      Preterm      AB      Living         SAB      IAB      Ectopic      Multiple      Live Births               Home Medications    Prior to Admission medications   Medication Sig Start Date End Date Taking? Authorizing Provider  predniSONE (STERAPRED UNI-PAK 21 TAB) 10 MG (21) TBPK tablet Take by mouth daily. Take 6 tabs by mouth daily  for 2 days, then 5 tabs for 2 days, then 4 tabs for 2 days, then 3 tabs for 2 days, 2 tabs for 2 days, then 1 tab by mouth daily for 2 days 12/01/20  Yes Rushie Chestnut, PA-C    Family History Family History  Problem Relation Age of Onset  . Heart disease Mother   . Hypertension Mother   . Diabetes Mother     Social History Social History   Tobacco Use  . Smoking status: Never Smoker  . Smokeless tobacco: Never Used  Substance Use Topics  . Alcohol use: No  . Drug use: No     Allergies   Patient has no known allergies.   Review of Systems Review of Systems  As stated  above in HPI Physical Exam Triage Vital Signs ED Triage Vitals  Enc Vitals Group     BP 12/01/20 1447 (!) 143/84     Pulse Rate 12/01/20 1447 93     Resp 12/01/20 1447 16     Temp 12/01/20 1447 98 F (36.7 C)     Temp Source 12/01/20 1447 Oral     SpO2 12/01/20 1447 100 %     Weight --      Height --      Head Circumference --      Peak Flow --      Pain Score 12/01/20 1444 6     Pain Loc --      Pain Edu? --      Excl. in GC? --    No data found.  Updated Vital Signs BP (!) 143/84 (BP Location: Left Arm)   Pulse 93   Temp 98 F (36.7 C) (Oral)   Resp 16   LMP 12/01/2020   SpO2 100%   Physical Exam Vitals and nursing note reviewed.  Cardiovascular:     Rate and Rhythm: Normal rate and regular rhythm.     Pulses: Normal pulses.     Heart sounds: Normal heart sounds.  Pulmonary:     Effort: Pulmonary effort is normal.     Breath sounds: Normal breath sounds.  Musculoskeletal:        General: No swelling or tenderness. Normal range of motion.  Skin:    General: Skin is  warm.     Capillary Refill: Capillary refill takes less than 2 seconds.     Comments: NO decreased warmth of limbs or skin color changes  Neurological:     General: No focal deficit present.     Sensory: No sensory deficit.     Motor: No weakness.     Coordination: Coordination normal.     Gait: Gait normal.     Deep Tendon Reflexes: Reflexes normal.      UC Treatments / Results  Labs (all labs ordered are listed, but only abnormal results are displayed) Labs Reviewed  CBG MONITORING, ED    EKG   Radiology DG Lumbar Spine Complete  Result Date: 12/01/2020 CLINICAL DATA:  Right lower back pain, lower extremity numbness EXAM: LUMBAR SPINE - COMPLETE 4+ VIEW COMPARISON:  None. FINDINGS: Frontal, bilateral oblique, lateral views of the lumbar spine are obtained. There are 5 non-rib-bearing lumbar type vertebral bodies in grossly normal alignment. There is mild spondylosis at L5-S1, with prominent facet hypertrophy at L4-5 and L5-S1. No acute fractures. Sacroiliac joints are normal. IMPRESSION: 1. Spondylosis and facet hypertrophy at the lumbosacral junction. No acute fracture. Electronically Signed   By: Sharlet Salina M.D.   On: 12/01/2020 15:49    Procedures Procedures (including critical care time)  Medications Ordered in UC Medications - No data to display  Initial Impression / Assessment and Plan / UC Course  I have reviewed the triage vital signs and the nursing notes.  Pertinent labs & imaging results that were available during my care of the patient were reviewed by me and considered in my medical decision making (see chart for details).     New. X ray pending to ensure no sign of obvious disc herniation. Neurological and vascular examination intact. Will treat with steroids and close monitoring. Orthopedic follow up recommended. Discussed how to use along with common potential side effects and precautions. Discussed red flag signs and symptoms.  Final Clinical  Impressions(s) /  UC Diagnoses   Final diagnoses:  Acute right-sided low back pain with right-sided sciatica   Discharge Instructions   None    ED Prescriptions    Medication Sig Dispense Auth. Provider   predniSONE (STERAPRED UNI-PAK 21 TAB) 10 MG (21) TBPK tablet Take by mouth daily. Take 6 tabs by mouth daily  for 2 days, then 5 tabs for 2 days, then 4 tabs for 2 days, then 3 tabs for 2 days, 2 tabs for 2 days, then 1 tab by mouth daily for 2 days 42 tablet Shunna Mikaelian, Brand Males, [place]     PDMP not reviewed this encounter.   Rushie Chestnut, [place] 12/01/20 1605

## 2020-12-10 ENCOUNTER — Other Ambulatory Visit: Payer: Self-pay

## 2020-12-10 ENCOUNTER — Encounter (HOSPITAL_COMMUNITY): Payer: Self-pay

## 2020-12-10 ENCOUNTER — Emergency Department (HOSPITAL_COMMUNITY)
Admission: EM | Admit: 2020-12-10 | Discharge: 2020-12-10 | Disposition: A | Discharge status: Home / Self Care | Payer: BC Managed Care – PPO | Attending: Emergency Medicine | Admitting: Emergency Medicine

## 2020-12-10 DIAGNOSIS — M5441 Lumbago with sciatica, right side: Secondary | ICD-10-CM | POA: Diagnosis not present

## 2020-12-10 DIAGNOSIS — M545 Low back pain, unspecified: Secondary | ICD-10-CM | POA: Diagnosis present

## 2020-12-10 NOTE — Discharge Instructions (Signed)
It appears that you have sciatica related to nerve irritation either centrally or peripherally.  Try using heat on the sore area and gently stretch out the muscles of your lower back and hips.  Call the orthopedic office for a follow-up appoint to be seen for further evaluation treatment.

## 2020-12-10 NOTE — ED Triage Notes (Signed)
Pt reports lower back pain worse on the right side and intermittent right leg numbness since around 3/7. Seen at [facility] for same. Given rx for prednisone. Denies pain. Ambulatory,

## 2020-12-10 NOTE — ED Provider Notes (Signed)
[facility]-EMERGENCY DEPT Provider Note   CSN: [id number] Arrival date & time: 12/10/20  1950     History Chief Complaint  Patient presents with  . Back Pain    Patricia Boone is a 45 y.o. female.  HPI She complains of low back pain, mostly right-sided for 1 month without trauma.  In the last week she has noted numbness of the right leg, posteriorly.  She was treated for and is completed a course of prednisone, as a taper.  She reports persistent symptoms.  She denies difficulty walking, loss of bowel or bladder continence.  She has not had any fever or chills.  She works, as a Solicitor at [employer].  Sometimes lifting causes discomfort.  No prior chronic back pain.  There are no other known modifying factors.    Past Medical History:  Diagnosis Date  . Carpal tunnel syndrome on both sides    Has brace, has seen Dr. Eulah Pont  . Carpal tunnel syndrome, bilateral 07/05/2016  . Common migraine with intractable migraine 06/22/2016  . GERD (gastroesophageal reflux disease)   . Prediabetes   . Trigger finger   . Vertigo     Patient Active Problem List   Diagnosis Date Noted  . Carpal tunnel syndrome, bilateral 07/05/2016  . Paresthesia 06/22/2016  . Common migraine with intractable migraine 06/22/2016  . Prediabetes   . GERD (gastroesophageal reflux disease)   . Obesity     Past Surgical History:  Procedure Laterality Date  . BREAST REDUCTION SURGERY Bilateral    Age 45  . CARPAL TUNNEL RELEASE Right 11/23/2016   Procedure: RIGHT CARPAL TUNNEL RELEASE;  Surgeon: Cindee Salt, MD;  Location: [facility];  Service: Orthopedics;  Laterality: Right;  . TRIGGER FINGER RELEASE Right 11/23/2016   Procedure: RELEASE TRIGGER FINGER/A-1 PULLEY RIGHT RING FINGER;  Surgeon: Cindee Salt, MD;  Location: [facility];  Service: Orthopedics;  Laterality: Right;     OB History    Gravida  1   Para      Term      Preterm      AB       Living        SAB      IAB      Ectopic      Multiple      Live Births              Family History  Problem Relation Age of Onset  . Heart disease Mother   . Hypertension Mother   . Diabetes Mother     Social History   Tobacco Use  . Smoking status: Never Smoker  . Smokeless tobacco: Never Used  Substance Use Topics  . Alcohol use: No  . Drug use: No    Home Medications Prior to Admission medications   Medication Sig Start Date End Date Taking? Authorizing Provider  predniSONE (STERAPRED UNI-PAK 21 TAB) 10 MG (21) TBPK tablet Take by mouth daily. Take 6 tabs by mouth daily  for 2 days, then 5 tabs for 2 days, then 4 tabs for 2 days, then 3 tabs for 2 days, 2 tabs for 2 days, then 1 tab by mouth daily for 2 days 12/01/20   Rushie Chestnut, PA-C    Allergies    Patient has no known allergies.  Review of Systems   Review of Systems  All other systems reviewed and are negative.   Physical Exam Updated Vital Signs BP Marland Kitchen)  135/92 (BP Location: Left Arm)   Pulse 83   Temp 98.3 F (36.8 C) (Oral)   Resp 16   LMP 12/01/2020   SpO2 100%   Physical Exam Vitals and nursing note reviewed.  Constitutional:      General: She is not in acute distress.    Appearance: She is well-developed. She is not ill-appearing, toxic-appearing or diaphoretic.  HENT:     Head: Normocephalic and atraumatic.     Right Ear: External ear normal.     Left Ear: External ear normal.  Eyes:     Conjunctiva/sclera: Conjunctivae normal.     Pupils: Pupils are equal, round, and reactive to light.  Neck:     Trachea: Phonation normal.  Cardiovascular:     Rate and Rhythm: Normal rate.  Pulmonary:     Effort: Pulmonary effort is normal.  Abdominal:     General: There is no distension.  Musculoskeletal:        General: Normal range of motion.     Cervical back: Normal range of motion and neck supple.     Comments: Mild tenderness, right lumbar.  Walks with normal gait.  Normal  strength, lower extremities, legs and feet.  Skin:    General: Skin is warm and dry.  Neurological:     Mental Status: She is alert and oriented to person, place, and time.     Cranial Nerves: No cranial nerve deficit.     Sensory: No sensory deficit.     Motor: No abnormal muscle tone.     Coordination: Coordination normal.  Psychiatric:        Mood and Affect: Mood normal.        Behavior: Behavior normal.        Thought Content: Thought content normal.        Judgment: Judgment normal.     ED Results / Procedures / Treatments   Labs (all labs ordered are listed, but only abnormal results are displayed) Labs Reviewed - No data to display  EKG None  Radiology No results found.  Procedures Procedures   Medications Ordered in ED Medications - No data to display  ED Course  I have reviewed the triage vital signs and the nursing notes.  Pertinent labs & imaging results that were available during my care of the patient were reviewed by me and considered in my medical decision making (see chart for details).    MDM Rules/Calculators/A&P                           Patient Vitals for the past 24 hrs:  BP Temp Temp src Pulse Resp SpO2  12/10/20 1956 (!) 135/92 98.3 F (36.8 C) Oral 83 16 100 %    9:18 PM Reevaluation with update and discussion. After initial assessment and treatment, an updated evaluation reveals no change in clinical status, findings discussed with the patient and all questions were answered. Mancel Bale   Medical Decision Making:  This patient is presenting for evaluation of low back pain with numbness of the right leg, which does require a range of treatment options, and is a complaint that involves a moderate and high risk of morbidity and mortality. The differential diagnoses include radiculopathy, sciatica, nerve impingement. I decided to review old records, and in summary Young female presenting with low back pain without trauma associated with  numbness of the right leg.  I did not require additional historical information from  anyone.    Critical Interventions-evaluation, discussion with patient  After These Interventions, the Patient was reevaluated and was found stable for discharge.  Nonspecific findings, without high suspicion for spinal myelopathy.  We will treat as sciatica, with recommendation for close follow-up with orthopedics.  Patient agreeable this plan.  She is to use heat and gently stretch out the muscles of the low back and hips.  CRITICAL CARE-no Performed by: Mancel Bale  Nursing Notes Reviewed/ Care Coordinated Applicable Imaging Reviewed Interpretation of Laboratory Data incorporated into ED treatment  The patient appears reasonably screened and/or stabilized for discharge and I doubt any other medical condition or other [facility] requiring further screening, evaluation, or treatment in the ED at this time prior to discharge.  Plan: Home Medications-Tylenol if needed for pain; Home Treatments-heat to affected area; return here if the recommended treatment, does not improve the symptoms; Recommended follow up-PCP, as needed, orthopedics for follow-up care and treatment as needed.     Final Clinical Impression(s) / ED Diagnoses Final diagnoses:  Right-sided low back pain with right-sided sciatica, unspecified chronicity    Rx / DC Orders ED Discharge Orders    None       Mancel Bale, MD 12/10/20 2120

## 2021-06-08 ENCOUNTER — Other Ambulatory Visit: Payer: Self-pay | Admitting: Orthopedic Surgery

## 2021-06-08 DIAGNOSIS — G8929 Other chronic pain: Secondary | ICD-10-CM

## 2021-06-16 ENCOUNTER — Other Ambulatory Visit: Payer: Self-pay

## 2021-06-16 ENCOUNTER — Ambulatory Visit
Admission: RE | Admit: 2021-06-16 | Discharge: 2021-06-16 | Disposition: A | Discharge status: Home / Self Care | Payer: BC Managed Care – PPO | Source: Ambulatory Visit | Attending: Orthopedic Surgery | Admitting: Orthopedic Surgery

## 2021-06-16 DIAGNOSIS — G8929 Other chronic pain: Secondary | ICD-10-CM

## 2021-06-16 MED ORDER — METHYLPREDNISOLONE ACETATE 40 MG/ML INJ SUSP (RADIOLOG
80.0000 mg | Freq: Once | INTRAMUSCULAR | Status: AC
  Administered 2021-06-16: 80 mg via EPIDURAL

## 2021-06-16 MED ORDER — IOPAMIDOL (ISOVUE-M 200) INJECTION 41%
1.0000 mL | Freq: Once | INTRAMUSCULAR | Status: AC
  Administered 2021-06-16: 1 mL via EPIDURAL

## 2021-06-16 NOTE — Discharge Instructions (Signed)

## 2022-10-14 ENCOUNTER — Encounter (HOSPITAL_COMMUNITY): Payer: Self-pay

## 2022-10-14 ENCOUNTER — Ambulatory Visit (HOSPITAL_COMMUNITY)
Admission: EM | Admit: 2022-10-14 | Discharge: 2022-10-14 | Disposition: A | Discharge status: Home / Self Care | Payer: BC Managed Care – PPO | Attending: Emergency Medicine | Admitting: Emergency Medicine

## 2022-10-14 DIAGNOSIS — B349 Viral infection, unspecified: Secondary | ICD-10-CM

## 2022-10-14 LAB — POC INFLUENZA A AND B ANTIGEN (URGENT CARE ONLY)
INFLUENZA A ANTIGEN, POC: NEGATIVE
INFLUENZA B ANTIGEN, POC: NEGATIVE

## 2022-10-14 NOTE — Discharge Instructions (Addendum)
I recommend tylenol every 4-6 hours to control fever, aches  You can use OTC cough medicine such as Delsym Can also use honey or cough drops  Try to drink a lot of fluids! This is especially important if you have diarrhea

## 2022-10-14 NOTE — ED Provider Notes (Signed)
[place]    CSN: [id number] Arrival date & time: 10/14/22  0909      History   Chief Complaint Chief Complaint  Patient presents with   Diarrhea    My body has aches all over and I have a slight cough - Entered by patient   Generalized Body Aches   Fever   Cough    HPI Patricia Boone is a 47 y.o. female.  2 day history of body aches, slight cough Fever 101 yesterday, none this morning Diarrhea started this morning. No vomiting Drinking lots of fluids Tylenol taken yesterday, no medications today Possible sick contacts at work  Past Medical History:  Diagnosis Date   Carpal tunnel syndrome on both sides    Has brace, has seen Dr. Percell Miller   Carpal tunnel syndrome, bilateral 07/05/2016   Common migraine with intractable migraine 06/22/2016   GERD (gastroesophageal reflux disease)    Prediabetes    Trigger finger    Vertigo     Patient Active Problem List   Diagnosis Date Noted   Carpal tunnel syndrome, bilateral 07/05/2016   Paresthesia 06/22/2016   Common migraine with intractable migraine 06/22/2016   Prediabetes    GERD (gastroesophageal reflux disease)    Obesity     Past Surgical History:  Procedure Laterality Date   BREAST REDUCTION SURGERY Bilateral    Age 50   CARPAL TUNNEL RELEASE Right 11/23/2016   Procedure: RIGHT CARPAL TUNNEL RELEASE;  Surgeon: Daryll Brod, MD;  Location: [place];  Service: Orthopedics;  Laterality: Right;   TRIGGER FINGER RELEASE Right 11/23/2016   Procedure: RELEASE TRIGGER FINGER/A-1 PULLEY RIGHT RING FINGER;  Surgeon: Daryll Brod, MD;  Location: [place];  Service: Orthopedics;  Laterality: Right;    OB History     Gravida  1   Para      Term      Preterm      AB      Living         SAB      IAB      Ectopic      Multiple      Live Births               Home Medications    Prior to Admission medications   Not on File    Family History Family  History  Problem Relation Age of Onset   Heart disease Mother    Hypertension Mother    Diabetes Mother     Social History Social History   Tobacco Use   Smoking status: Never   Smokeless tobacco: Never  Vaping Use   Vaping Use: Never used  Substance Use Topics   Alcohol use: No   Drug use: No     Allergies   Patient has no known allergies.   Review of Systems Review of Systems As per HPI  Physical Exam Triage Vital Signs ED Triage Vitals  Enc Vitals Group     BP 10/14/22 0929 (!) 150/98     Pulse Rate 10/14/22 0929 94     Resp 10/14/22 0929 16     Temp 10/14/22 0929 98.1 F (36.7 C)     Temp Source 10/14/22 0929 Oral     SpO2 10/14/22 0929 98 %     Weight --      Height --      Head Circumference --      Peak Flow --  Pain Score 10/14/22 0930 5     Pain Loc --      Pain Edu? --      Excl. in [place]? --    No data found.  Updated Vital Signs BP (!) 150/98 (BP Location: Left Arm)   Pulse 94   Temp 98.1 F (36.7 C) (Oral)   Resp 16   LMP 10/07/2022   SpO2 98%    Physical Exam Vitals and nursing note reviewed.  Constitutional:      General: She is not in acute distress. HENT:     Nose: No rhinorrhea.     Mouth/Throat:     Mouth: Mucous membranes are moist.     Pharynx: Oropharynx is clear. No posterior oropharyngeal erythema.  Eyes:     Conjunctiva/sclera: Conjunctivae normal.  Cardiovascular:     Rate and Rhythm: Normal rate and regular rhythm.     Pulses: Normal pulses.     Heart sounds: Normal heart sounds.  Pulmonary:     Effort: Pulmonary effort is normal.     Breath sounds: Normal breath sounds.  Musculoskeletal:     Cervical back: Normal range of motion. No rigidity.  Lymphadenopathy:     Cervical: No cervical adenopathy.  Skin:    General: Skin is warm and dry.  Neurological:     Mental Status: She is alert and oriented to person, place, and time.     UC Treatments / Results  Labs (all labs ordered are listed, but only  abnormal results are displayed) Labs Reviewed  POC INFLUENZA A AND B ANTIGEN (URGENT CARE ONLY)    EKG  Radiology No results found.  Procedures Procedures   Medications Ordered in UC Medications - No data to display  Initial Impression / Assessment and Plan / UC Course  I have reviewed the triage vital signs and the nursing notes.  Pertinent labs & imaging results that were available during my care of the patient were reviewed by me and considered in my medical decision making (see chart for details).  Rapid flu A/B negative. Discussed viral etiology, symptomatic care No questions at this time Work note provided Return precautions discussed. Patient agrees to plan  Final Clinical Impressions(s) / UC Diagnoses   Final diagnoses:  Viral illness     Discharge Instructions      I recommend tylenol every 4-6 hours to control fever, aches  You can use OTC cough medicine such as Delsym Can also use honey or cough drops  Try to drink a lot of fluids! This is especially important if you have diarrhea      ED Prescriptions   None    PDMP not reviewed this encounter.   Shawnette Augello, [place], PA-C 10/14/22 1026

## 2022-10-14 NOTE — ED Triage Notes (Signed)
Patient c/o diarrhea that started this AM, generalized body aches, a productive cough with clear sputum, and a fever x 2 days.  Patient states she took Tylenol yesterday.

## 2023-04-22 ENCOUNTER — Ambulatory Visit
Admission: EM | Admit: 2023-04-22 | Discharge: 2023-04-22 | Disposition: A | Discharge status: Home / Self Care | Payer: BC Managed Care – PPO | Attending: Internal Medicine | Admitting: Internal Medicine

## 2023-04-22 DIAGNOSIS — N926 Irregular menstruation, unspecified: Secondary | ICD-10-CM

## 2023-04-22 DIAGNOSIS — M545 Low back pain, unspecified: Secondary | ICD-10-CM

## 2023-04-22 LAB — POCT URINALYSIS DIP (MANUAL ENTRY)
Bilirubin, UA: NEGATIVE
Glucose, UA: NEGATIVE mg/dL
Ketones, POC UA: NEGATIVE mg/dL
Nitrite, UA: NEGATIVE
Protein Ur, POC: 100 mg/dL — AB
Spec Grav, UA: 1.02 (ref 1.010–1.025)
Urobilinogen, UA: 1 E.U./dL
pH, UA: 7.5 (ref 5.0–8.0)

## 2023-04-22 LAB — POCT URINE PREGNANCY: Preg Test, Ur: NEGATIVE

## 2023-04-22 MED ORDER — METHOCARBAMOL 500 MG PO TABS: 500.0000 mg | ORAL_TABLET | Freq: Two times a day (BID) | ORAL | 0 refills | Status: AC | PRN

## 2023-04-22 MED ORDER — IBUPROFEN 600 MG PO TABS: 600.0000 mg | ORAL_TABLET | Freq: Four times a day (QID) | ORAL | 0 refills | Status: AC | PRN

## 2023-04-22 NOTE — ED Triage Notes (Signed)
Pt states lower back pain for the past 2 days.  Denies injury. States she took tylenol and ibuprofen at home with no relief.

## 2023-04-22 NOTE — Discharge Instructions (Signed)
Urine was clear from infection.  Suspect that you have a lumbar/muscle strain.  I have prescribed you a muscle relaxer to take as needed.  Please be advised that it can make you drowsy so do not drive or drink alcohol with taking it.  I have also prescribed you ibuprofen to take as needed.  Do not take any additional ibuprofen, Advil, Aleve over-the-counter while taking this prescription ibuprofen.  Alternate ice and heat to affected area.  Urine pregnancy test was negative.  Vaginal swab is pending.  We will call if it is abnormal.  I encourage you to follow-up with your gynecologist given irregular menstrual cycles recently as well.

## 2023-04-22 NOTE — ED Provider Notes (Signed)
[facility]    CSN: [id number] Arrival date & time: 04/22/23  1326      History   Chief Complaint Chief Complaint  Patient presents with   Back Pain    HPI Patricia Boone is a 47 y.o. female.   Patient presents with left lower back pain that started about 2 days ago.  Denies any obvious injury to the area.  Reports that she has a history of recurrent back pain but it typically occurs in the right side.  Pain does not radiate down leg.  Denies numbness or tingling.  Denies dysuria, urinary frequency, urinary or bowel continence, saddle anesthesia.  Patient states that it started at the same time as her menstrual cycle but she is not sure if this is related.  Reports that she has never had back pain with her menstrual cycles previously.  Denies abdominal pain.  Has taken Tylenol and ibuprofen at home for pain with minimal improvement.  Denies vaginal discharge.  Reports that she had 2 menstrual cycles last month.  States that she is sexually active but denies exposure to STD.   Back Pain   Past Medical History:  Diagnosis Date   Carpal tunnel syndrome on both sides    Has brace, has seen Dr. Eulah Pont   Carpal tunnel syndrome, bilateral 07/05/2016   Common migraine with intractable migraine 06/22/2016   GERD (gastroesophageal reflux disease)    Prediabetes    Trigger finger    Vertigo     Patient Active Problem List   Diagnosis Date Noted   Carpal tunnel syndrome, bilateral 07/05/2016   Paresthesia 06/22/2016   Common migraine with intractable migraine 06/22/2016   Prediabetes    GERD (gastroesophageal reflux disease)    Obesity     Past Surgical History:  Procedure Laterality Date   BREAST REDUCTION SURGERY Bilateral    Age 51   CARPAL TUNNEL RELEASE Right 11/23/2016   Procedure: RIGHT CARPAL TUNNEL RELEASE;  Surgeon: Cindee Salt, MD;  Location: [facility];  Service: Orthopedics;  Laterality: Right;   TRIGGER FINGER RELEASE Right 11/23/2016    Procedure: RELEASE TRIGGER FINGER/A-1 PULLEY RIGHT RING FINGER;  Surgeon: Cindee Salt, MD;  Location: [facility];  Service: Orthopedics;  Laterality: Right;    OB History     Gravida  1   Para      Term      Preterm      AB      Living         SAB      IAB      Ectopic      Multiple      Live Births               Home Medications    Prior to Admission medications   Medication Sig Start Date End Date Taking? Authorizing Provider  ibuprofen (ADVIL) 600 MG tablet Take 1 tablet (600 mg total) by mouth every 6 (six) hours as needed for mild pain or moderate pain. 04/22/23  Yes Xandra Laramee, Acie Fredrickson, FNP  methocarbamol (ROBAXIN) 500 MG tablet Take 1 tablet (500 mg total) by mouth 2 (two) times daily as needed for muscle spasms. 04/22/23  Yes Davy Westmoreland, Acie Fredrickson, FNP    Family History Family History  Problem Relation Age of Onset   Heart disease Mother    Hypertension Mother    Diabetes Mother     Social History Social History   Tobacco Use  Smoking status: Never   Smokeless tobacco: Never  Vaping Use   Vaping status: Never Used  Substance Use Topics   Alcohol use: No   Drug use: No     Allergies   Patient has no known allergies.   Review of Systems Review of Systems Per HPI  Physical Exam Triage Vital Signs ED Triage Vitals  Encounter Vitals Group     BP 04/22/23 1331 (!) 138/93     Systolic BP Percentile --      Diastolic BP Percentile --      Pulse Rate 04/22/23 1331 87     Resp 04/22/23 1331 16     Temp 04/22/23 1331 98 F (36.7 C)     Temp Source 04/22/23 1331 Oral     SpO2 04/22/23 1331 95 %     Weight --      Height --      Head Circumference --      Peak Flow --      Pain Score 04/22/23 1332 8     Pain Loc --      Pain Education --      Exclude from Growth Chart --    No data found.  Updated Vital Signs BP (!) 138/93 (BP Location: Left Arm)   Pulse 87   Temp 98 F (36.7 C) (Oral)   Resp 16   LMP 04/20/2023 (Exact  Date)   SpO2 95%   Visual Acuity Right Eye Distance:   Left Eye Distance:   Bilateral Distance:    Right Eye Near:   Left Eye Near:    Bilateral Near:     Physical Exam Constitutional:      General: She is not in acute distress.    Appearance: Normal appearance. She is not toxic-appearing or diaphoretic.  HENT:     Head: Normocephalic and atraumatic.  Eyes:     Extraocular Movements: Extraocular movements intact.     Conjunctiva/sclera: Conjunctivae normal.  Pulmonary:     Effort: Pulmonary effort is normal.  Genitourinary:    Comments: Deferred with shared decision making.  Self swab performed. Musculoskeletal:     Comments: Tenderness to palpation to left lower lumbar region that extends slightly into the left upper buttocks.  There is no direct spinal tenderness, crepitus, step-off.  No swelling or discoloration noted.   Neurological:     General: No focal deficit present.     Mental Status: She is alert and oriented to person, place, and time. Mental status is at baseline.     Deep Tendon Reflexes: Reflexes are normal and symmetric.  Psychiatric:        Mood and Affect: Mood normal.        Behavior: Behavior normal.        Thought Content: Thought content normal.        Judgment: Judgment normal.      UC Treatments / Results  Labs (all labs ordered are listed, but only abnormal results are displayed) Labs Reviewed  POCT URINALYSIS DIP (MANUAL ENTRY) - Abnormal; Notable for the following components:      Result Value   Color, UA red (*)    Blood, UA large (*)    Protein Ur, POC =100 (*)    Leukocytes, UA Trace (*)    All other components within normal limits  POCT URINE PREGNANCY  CERVICOVAGINAL ANCILLARY ONLY    EKG   Radiology No results found.  Procedures Procedures (including critical care time)  Medications  Ordered in UC Medications - No data to display  Initial Impression / Assessment and Plan / UC Course  I have reviewed the triage vital  signs and the nursing notes.  Pertinent labs & imaging results that were available during my care of the patient were reviewed by me and considered in my medical decision making (see chart for details).     UA completed which was unremarkable for any acute UTI.  It does show trace leukocytes but with no associated symptoms, will defer any treatment for UTI at this time.  Suspect that patient's back pain is lumbar strain given it is reproducible with palpation and movement exacerbates pain.  Will prescribe muscle relaxer and NSAIDs for patient to take as needed.  Advised patient muscle relaxer can make her drowsy and do not drive or drink alcohol with taking it.  Advised supportive care as well.  Urine pregnancy test completed given irregular menstrual cycles which was negative.  Will send cervicovaginal swab to test for BV and yeast as patient is not concerned for STDs to ensure that irregular menstrual cycles are not related to vaginitis.  Encouraged her to follow-up with her established gynecologist to evaluate irregular menstrual cycles. I am not suspicious that irregular menstrual cycles are related to back pain at this time.  Patient verbalized understanding and was agreeable with plan. Final Clinical Impressions(s) / UC Diagnoses   Final diagnoses:  Acute left-sided low back pain without sciatica  Irregular periods     Discharge Instructions      Urine was clear from infection.  Suspect that you have a lumbar/muscle strain.  I have prescribed you a muscle relaxer to take as needed.  Please be advised that it can make you drowsy so do not drive or drink alcohol with taking it.  I have also prescribed you ibuprofen to take as needed.  Do not take any additional ibuprofen, Advil, Aleve over-the-counter while taking this prescription ibuprofen.  Alternate ice and heat to affected area.  Urine pregnancy test was negative.  Vaginal swab is pending.  We will call if it is abnormal.  I encourage you  to follow-up with your gynecologist given irregular menstrual cycles recently as well.    ED Prescriptions     Medication Sig Dispense Auth. Provider   methocarbamol (ROBAXIN) 500 MG tablet Take 1 tablet (500 mg total) by mouth 2 (two) times daily as needed for muscle spasms. 20 tablet [place], Grace E, [place]   ibuprofen (ADVIL) 600 MG tablet Take 1 tablet (600 mg total) by mouth every 6 (six) hours as needed for mild pain or moderate pain. 30 tablet [place], Acie Fredrickson, [place]      PDMP not reviewed this encounter.   Gustavus Bryant, [place] 04/22/23 [phone]

## 2023-04-25 ENCOUNTER — Telehealth: Payer: Self-pay | Admitting: Emergency Medicine

## 2023-04-25 MED ORDER — METRONIDAZOLE 500 MG PO TABS: 500.0000 mg | ORAL_TABLET | Freq: Two times a day (BID) | ORAL | 0 refills | Status: DC

## 2023-08-12 ENCOUNTER — Ambulatory Visit
Admission: EM | Admit: 2023-08-12 | Discharge: 2023-08-12 | Disposition: A | Discharge status: Home / Self Care | Payer: BC Managed Care – PPO | Attending: Internal Medicine | Admitting: Internal Medicine

## 2023-08-12 ENCOUNTER — Other Ambulatory Visit: Payer: Self-pay

## 2023-08-12 DIAGNOSIS — Z113 Encounter for screening for infections with a predominantly sexual mode of transmission: Secondary | ICD-10-CM | POA: Diagnosis present

## 2023-08-12 DIAGNOSIS — Z202 Contact with and (suspected) exposure to infections with a predominantly sexual mode of transmission: Secondary | ICD-10-CM | POA: Diagnosis present

## 2023-08-12 MED ORDER — METRONIDAZOLE 500 MG PO TABS: 500.0000 mg | ORAL_TABLET | Freq: Two times a day (BID) | ORAL | 0 refills | Status: DC

## 2023-08-12 NOTE — Discharge Instructions (Signed)
I have prescribed an antibiotic for trichomonas exposure.  Vaginal swab is pending.

## 2023-08-12 NOTE — ED Provider Notes (Addendum)
[facility]    CSN: [id number] Arrival date & time: 08/12/23  1437      History   Chief Complaint No chief complaint on file.   HPI Patricia Boone is a 47 y.o. female.   Patient presents today for STD testing given that her recent sexual partner told her that they tested positive for trichomonas.  Reports that she did have unprotected intercourse with them approximately 1 week ago.  She denies any current symptoms.  Last menstrual cycle was 08/04/2023.  Patient's heart rate is elevated but she reports that she is currently under stress given current reason for urgent care visit as well as her recently getting let go from her job today.     Past Medical History:  Diagnosis Date   Carpal tunnel syndrome on both sides    Has brace, has seen Dr. Eulah Pont   Carpal tunnel syndrome, bilateral 07/05/2016   Common migraine with intractable migraine 06/22/2016   GERD (gastroesophageal reflux disease)    Prediabetes    Trigger finger    Vertigo     Patient Active Problem List   Diagnosis Date Noted   Carpal tunnel syndrome, bilateral 07/05/2016   Paresthesia 06/22/2016   Common migraine with intractable migraine 06/22/2016   Prediabetes    GERD (gastroesophageal reflux disease)    Obesity     Past Surgical History:  Procedure Laterality Date   BREAST REDUCTION SURGERY Bilateral    Age 31   CARPAL TUNNEL RELEASE Right 11/23/2016   Procedure: RIGHT CARPAL TUNNEL RELEASE;  Surgeon: Cindee Salt, MD;  Location: [facility];  Service: Orthopedics;  Laterality: Right;   TRIGGER FINGER RELEASE Right 11/23/2016   Procedure: RELEASE TRIGGER FINGER/A-1 PULLEY RIGHT RING FINGER;  Surgeon: Cindee Salt, MD;  Location: [facility];  Service: Orthopedics;  Laterality: Right;    OB History     Gravida  1   Para      Term      Preterm      AB      Living         SAB      IAB      Ectopic      Multiple      Live Births                Home Medications    Prior to Admission medications   Medication Sig Start Date End Date Taking? Authorizing Provider  metroNIDAZOLE (FLAGYL) 500 MG tablet Take 1 tablet (500 mg total) by mouth 2 (two) times daily. 08/12/23  Yes Truda Staub, Rolly Salter E, FNP  ibuprofen (ADVIL) 600 MG tablet Take 1 tablet (600 mg total) by mouth every 6 (six) hours as needed for mild pain or moderate pain. 04/22/23   Gustavus Bryant, FNP  methocarbamol (ROBAXIN) 500 MG tablet Take 1 tablet (500 mg total) by mouth 2 (two) times daily as needed for muscle spasms. 04/22/23   Gustavus Bryant, FNP    Family History Family History  Problem Relation Age of Onset   Heart disease Mother    Hypertension Mother    Diabetes Mother     Social History Social History   Tobacco Use   Smoking status: Some Days    Types: Cigars   Smokeless tobacco: Never  Vaping Use   Vaping status: Never Used  Substance Use Topics   Alcohol use: No   Drug use: No     Allergies   Patient has  no known allergies.   Review of Systems Review of Systems Per HPI  Physical Exam Triage Vital Signs ED Triage Vitals  Encounter Vitals Group     BP 08/12/23 1455 (!) 158/95     Systolic BP Percentile --      Diastolic BP Percentile --      Pulse Rate 08/12/23 1455 (!) 113     Resp --      Temp 08/12/23 1455 98.7 F (37.1 C)     Temp Source 08/12/23 1455 Oral     SpO2 08/12/23 1455 100 %     Weight 08/12/23 1452 180 lb (81.6 kg)     Height 08/12/23 1452 5\' 1"  (1.549 m)     Head Circumference --      Peak Flow --      Pain Score 08/12/23 1452 0     Pain Loc --      Pain Education --      Exclude from Growth Chart --    No data found.  Updated Vital Signs BP (!) 158/95 (BP Location: Left Arm)   Pulse (!) 113   Temp 98.7 F (37.1 C) (Oral)   Ht 5\' 1"  (1.549 m)   Wt 180 lb (81.6 kg)   LMP 08/04/2023   SpO2 100%   BMI 34.01 kg/m   Visual Acuity Right Eye Distance:   Left Eye Distance:   Bilateral Distance:     Right Eye Near:   Left Eye Near:    Bilateral Near:     Physical Exam Constitutional:      General: She is not in acute distress.    Appearance: Normal appearance. She is not toxic-appearing or diaphoretic.  HENT:     Head: Normocephalic and atraumatic.  Eyes:     Extraocular Movements: Extraocular movements intact.     Conjunctiva/sclera: Conjunctivae normal.  Pulmonary:     Effort: Pulmonary effort is normal.  Genitourinary:    Comments: Deferred with shared decision making. Self swab performed.  Neurological:     General: No focal deficit present.     Mental Status: She is alert and oriented to person, place, and time. Mental status is at baseline.  Psychiatric:        Mood and Affect: Mood normal.        Behavior: Behavior normal.        Thought Content: Thought content normal.        Judgment: Judgment normal.      UC Treatments / Results  Labs (all labs ordered are listed, but only abnormal results are displayed) Labs Reviewed  CERVICOVAGINAL ANCILLARY ONLY    EKG   Radiology No results found.  Procedures Procedures (including critical care time)  Medications Ordered in UC Medications - No data to display  Initial Impression / Assessment and Plan / UC Course  I have reviewed the triage vital signs and the nursing notes.  Pertinent labs & imaging results that were available during my care of the patient were reviewed by me and considered in my medical decision making (see chart for details).     Given confirmed exposure to trichomonas, will prophylactically treat with metronidazole antibiotic today.  Cervicovaginal swab pending.  Advised strict follow-up precautions and safe sex practices.  Patient verbalized understanding and was agreeable with plan.  Heart rate elevated but suspect this is due to recent stress so no need for further workup for this. Final Clinical Impressions(s) / UC Diagnoses   Final diagnoses:  Trichomonas exposure  Screening  examination for venereal disease     Discharge Instructions      I have prescribed an antibiotic for trichomonas exposure.  Vaginal swab is pending.    ED Prescriptions     Medication Sig Dispense Auth. Provider   metroNIDAZOLE (FLAGYL) 500 MG tablet Take 1 tablet (500 mg total) by mouth 2 (two) times daily. 14 tablet [place], Acie Fredrickson, [place]      PDMP not reviewed this encounter.   Gustavus Bryant, [place] 08/12/23 1528    Gustavus Bryant, [place] 08/12/23 [phone]

## 2023-08-12 NOTE — ED Triage Notes (Addendum)
Patient states she would like STD testing, partner tested positive for Trichomoniasis, she received this information today. No symptoms.

## 2023-08-16 LAB — CERVICOVAGINAL ANCILLARY ONLY
Chlamydia: NEGATIVE
Comment: NEGATIVE
Comment: NEGATIVE
Comment: NORMAL
Neisseria Gonorrhea: NEGATIVE
Trichomonas: NEGATIVE

## 2024-04-03 ENCOUNTER — Encounter: Payer: Self-pay | Admitting: Emergency Medicine

## 2024-04-03 ENCOUNTER — Ambulatory Visit
Admission: EM | Admit: 2024-04-03 | Discharge: 2024-04-03 | Disposition: A | Discharge status: Home / Self Care | Payer: Self-pay | Attending: Emergency Medicine | Admitting: Emergency Medicine

## 2024-04-03 DIAGNOSIS — J069 Acute upper respiratory infection, unspecified: Secondary | ICD-10-CM

## 2024-04-03 LAB — POC SARS CORONAVIRUS 2 AG -  ED: SARS Coronavirus 2 Ag: NEGATIVE

## 2024-04-03 MED ORDER — AMOXICILLIN-POT CLAVULANATE 875-125 MG PO TABS: 1.0000 | ORAL_TABLET | Freq: Two times a day (BID) | ORAL | 0 refills | Status: AC

## 2024-04-03 MED ORDER — GUAIFENESIN-CODEINE 100-10 MG/5ML PO SOLN: 5.0000 mL | Freq: Four times a day (QID) | ORAL | 0 refills | Status: AC | PRN

## 2024-04-03 MED ORDER — BENZONATATE 100 MG PO CAPS: 100.0000 mg | ORAL_CAPSULE | Freq: Three times a day (TID) | ORAL | 0 refills | Status: DC

## 2024-04-03 NOTE — ED Provider Notes (Signed)
 [facility]    CSN: [id number] Arrival date & time: 04/03/24  0801      History   Chief Complaint Chief Complaint  Patient presents with   Cough   Generalized Body Aches   Nasal Congestion   Fever    HPI Patricia Boone is a 48 y.o. female.    Patient presents for evaluation of a fever peaking at 100.5, nasal congestion, bilateral ear fullness, sore throat, productive cough, wheezing and diarrhea present for 3 days.  Endorses loss of taste but able to smell, tolerable to food and liquids.  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  Has attempted use of Coricidin and an additional over-the-counter cough syrup with minimal improvement.  Denies respiratory history,\current smoker.     Past Medical History:  Diagnosis Date   Carpal tunnel syndrome on both sides    Has brace, has seen Dr. Beverley   Carpal tunnel syndrome, bilateral 07/05/2016   Common migraine with intractable migraine 06/22/2016   GERD (gastroesophageal reflux disease)    Prediabetes    Trigger finger    Vertigo     Patient Active Problem List   Diagnosis Date Noted   Carpal tunnel syndrome, bilateral 07/05/2016   Paresthesia 06/22/2016   Common migraine with intractable migraine 06/22/2016   Prediabetes    GERD (gastroesophageal reflux disease)    Obesity     Past Surgical History:  Procedure Laterality Date   BREAST REDUCTION SURGERY Bilateral    Age 73   CARPAL TUNNEL RELEASE Right 11/23/2016   Procedure: RIGHT CARPAL TUNNEL RELEASE;  Surgeon: Arley Curia, MD;  Location: [facility];  Service: Orthopedics;  Laterality: Right;   TRIGGER FINGER RELEASE Right 11/23/2016   Procedure: RELEASE TRIGGER FINGER/A-1 PULLEY RIGHT RING FINGER;  Surgeon: Arley Curia, MD;  Location: [facility];  Service: Orthopedics;  Laterality: Right;    OB History     Gravida  1   Para      Term      Preterm      AB      Living         SAB      IAB       Ectopic      Multiple      Live Births               Home Medications    Prior to Admission medications   Medication Sig Start Date End Date Taking? Authorizing Provider  ibuprofen  (ADVIL ) 600 MG tablet Take 1 tablet (600 mg total) by mouth every 6 (six) hours as needed for mild pain or moderate pain. 04/22/23   Hazen Darryle BRAVO, FNP  methocarbamol  (ROBAXIN ) 500 MG tablet Take 1 tablet (500 mg total) by mouth 2 (two) times daily as needed for muscle spasms. 04/22/23   Hazen Darryle BRAVO, FNP  metroNIDAZOLE  (FLAGYL ) 500 MG tablet Take 1 tablet (500 mg total) by mouth 2 (two) times daily. 08/12/23   Hazen Darryle BRAVO, FNP    Family History Family History  Problem Relation Age of Onset   Heart disease Mother    Hypertension Mother    Diabetes Mother     Social History Social History   Tobacco Use   Smoking status: Some Days    Types: Cigars    Passive exposure: Current   Smokeless tobacco: Never  Vaping Use   Vaping status: Never Used  Substance Use Topics   Alcohol use:  No   Drug use: No     Allergies   Patient has no known allergies.   Review of Systems Review of Systems  Constitutional:  Positive for fever. Negative for activity change, appetite change, chills, diaphoresis, fatigue and unexpected weight change.  HENT:  Positive for congestion, ear pain and sore throat. Negative for dental problem, drooling, ear discharge, facial swelling, hearing loss, mouth sores, nosebleeds, postnasal drip, rhinorrhea, sinus pressure, sinus pain, sneezing, tinnitus, trouble swallowing and voice change.   Respiratory:  Positive for cough and wheezing. Negative for apnea, choking, chest tightness, shortness of breath and stridor.   Gastrointestinal:  Positive for diarrhea. Negative for abdominal distention, abdominal pain, anal bleeding, blood in stool, constipation, nausea, rectal pain and vomiting.     Physical Exam Triage Vital Signs ED Triage Vitals  Encounter Vitals Group      BP 04/03/24 0826 (!) 161/100     Girls Systolic BP Percentile --      Girls Diastolic BP Percentile --      Boys Systolic BP Percentile --      Boys Diastolic BP Percentile --      Pulse Rate 04/03/24 0826 85     Resp 04/03/24 0826 18     Temp 04/03/24 0826 98.2 F (36.8 C)     Temp Source 04/03/24 0826 Oral     SpO2 04/03/24 0826 98 %     Weight 04/03/24 0824 179 lb 14.3 oz (81.6 kg)     Height --      Head Circumference --      Peak Flow --      Pain Score 04/03/24 0823 7     Pain Loc --      Pain Education --      Exclude from Growth Chart --    No data found.  Updated Vital Signs BP (!) 161/100 (BP Location: Left Arm)   Pulse 85   Temp 98.2 F (36.8 C) (Oral)   Resp 18   Wt 179 lb 14.3 oz (81.6 kg)   LMP 03/18/2024 (Approximate)   SpO2 98%   BMI 33.99 kg/m   Visual Acuity Right Eye Distance:   Left Eye Distance:   Bilateral Distance:    Right Eye Near:   Left Eye Near:    Bilateral Near:     Physical Exam Constitutional:      Appearance: Normal appearance.  HENT:     Head: Normocephalic.     Right Ear: Tympanic membrane, ear canal and external ear normal.     Left Ear: Tympanic membrane, ear canal and external ear normal.     Nose: Congestion present.     Mouth/Throat:     Pharynx: Posterior oropharyngeal erythema present. No oropharyngeal exudate.  Eyes:     Extraocular Movements: Extraocular movements intact.  Cardiovascular:     Rate and Rhythm: Normal rate and regular rhythm.     Pulses: Normal pulses.     Heart sounds: Normal heart sounds.  Pulmonary:     Effort: Pulmonary effort is normal.     Breath sounds: Normal breath sounds.  Musculoskeletal:     Cervical back: Normal range of motion and neck supple.  Neurological:     Mental Status: She is alert and oriented to person, place, and time. Mental status is at baseline.      UC Treatments / Results  Labs (all labs ordered are listed, but only abnormal results are displayed) Labs  Reviewed  POC SARS  CORONAVIRUS 2 AG -  ED    EKG   Radiology No results found.  Procedures Procedures (including critical care time)  Medications Ordered in UC Medications - No data to display  Initial Impression / Assessment and Plan / UC Course  I have reviewed the triage vital signs and the nursing notes.  Pertinent labs & imaging results that were available during my care of the patient were reviewed by me and considered in my medical decision making (see chart for details).  Viral URI with cough  Patient is in no signs of distress nor toxic appearing.  Blood pressure elevated at 161/100 in triage most likely related to persistent cough.  Low suspicion for pneumonia, pneumothorax or bronchitis and therefore will defer imaging.  Testing negative.  Etiology most likely viral.  Prescribed Tessalon  and guaifenesin  codeine , PDMP reviewed, low risk, watch and wait antibiotic placed at pharmacy.May use additional over-the-counter medications as needed for supportive care.  May follow-up with urgent care as needed if symptoms persist or worsen.  Note given.   Final Clinical Impressions(s) / UC Diagnoses   Final diagnoses:  None   Discharge Instructions   None    ED Prescriptions   None    PDMP not reviewed this encounter.   Teresa Shelba SAUNDERS, [place] 04/03/24 [phone]

## 2024-04-03 NOTE — ED Triage Notes (Signed)
 Pt presents c/o URI sxs, loss of taste, fever, and body aches x 4 days. Pt has tried OTC products but sxs have not improved. Pt denies emesis but does c/o diarrhea.

## 2024-04-03 NOTE — Discharge Instructions (Signed)
 Your symptoms today are most likely being caused by a virus and should steadily improve in time it can take up to 7 to 10 days before you truly start to see a turnaround however things will get better, if no improvement seen by Saturday may pick up Augmentin  which is an antibiotic from the pharmacy  In the meantime attempt use of Tessalon  every 8 hours as needed for coughing, may use cough syrup every 6 hours as needed, if the cough syrup makes you drowsy please use at bedtime    You can take Tylenol  and/or Ibuprofen  as needed for fever reduction and pain relief.   For cough: honey 1/2 to 1 teaspoon (you can dilute the honey in water or another fluid).  You can also use guaifenesin  and dextromethorphan for cough. You can use a humidifier for chest congestion and cough.  If you don't have a humidifier, you can sit in the bathroom with the hot shower running.      For sore throat: try warm salt water gargles, cepacol lozenges, throat spray, warm tea or water with lemon/honey, popsicles or ice, or OTC cold relief medicine for throat discomfort.   For congestion: take a daily anti-histamine like Zyrtec, Claritin, and a oral decongestant, such as pseudoephedrine.  You can also use Flonase 1-2 sprays in each nostril daily.   It is important to stay hydrated: drink plenty of fluids (water, gatorade/powerade/pedialyte, juices, or teas) to keep your throat moisturized and help further relieve irritation/discomfort.

## 2024-06-04 ENCOUNTER — Encounter: Payer: Self-pay | Admitting: Emergency Medicine

## 2024-06-04 ENCOUNTER — Ambulatory Visit: Admission: EM | Admit: 2024-06-04 | Discharge: 2024-06-04 | Disposition: A | Discharge status: Home / Self Care

## 2024-06-04 DIAGNOSIS — H8110 Benign paroxysmal vertigo, unspecified ear: Secondary | ICD-10-CM

## 2024-06-04 MED ORDER — MECLIZINE HCL 25 MG PO TABS: 25.0000 mg | ORAL_TABLET | Freq: Three times a day (TID) | ORAL | 0 refills | Status: AC | PRN

## 2024-06-04 MED ORDER — ONDANSETRON 4 MG PO TBDP
4.0000 mg | ORAL_TABLET | Freq: Once | ORAL | Status: AC
  Administered 2024-06-04: 4 mg via ORAL

## 2024-06-04 MED ORDER — ONDANSETRON 4 MG PO TBDP: 4.0000 mg | ORAL_TABLET | Freq: Three times a day (TID) | ORAL | 0 refills | Status: AC | PRN

## 2024-06-04 NOTE — ED Provider Notes (Signed)
 [facility]    CSN: [id number] Arrival date & time: 06/04/24  1518      History   Chief Complaint Chief Complaint  Patient presents with  . Dizziness    HPI Patricia Boone is a 48 y.o. female.    Dizziness Associated symptoms: no chest pain, no headaches, no nausea, no shortness of breath and no vomiting     Past Medical History:  Diagnosis Date  . Carpal tunnel syndrome on both sides    Has brace, has seen Dr. Beverley  . Carpal tunnel syndrome, bilateral 07/05/2016  . Common migraine with intractable migraine 06/22/2016  . GERD (gastroesophageal reflux disease)   . Prediabetes   . Trigger finger   . Vertigo     Patient Active Problem List   Diagnosis Date Noted  . Trigger ring finger of right hand 12/01/2016  . Bilateral carpal tunnel syndrome 07/05/2016  . Paresthesia 06/22/2016  . Common migraine with intractable migraine 06/22/2016  . Prediabetes   . GERD (gastroesophageal reflux disease)   . Obesity     Past Surgical History:  Procedure Laterality Date  . BREAST REDUCTION SURGERY Bilateral    Age 1  . CARPAL TUNNEL RELEASE Right 11/23/2016   Procedure: RIGHT CARPAL TUNNEL RELEASE;  Surgeon: Arley Curia, MD;  Location: [facility];  Service: Orthopedics;  Laterality: Right;  . TRIGGER FINGER RELEASE Right 11/23/2016   Procedure: RELEASE TRIGGER FINGER/A-1 PULLEY RIGHT RING FINGER;  Surgeon: Arley Curia, MD;  Location: [facility];  Service: Orthopedics;  Laterality: Right;    OB History     Gravida  1   Para      Term      Preterm      AB      Living         SAB      IAB      Ectopic      Multiple      Live Births               Home Medications    Prior to Admission medications   Medication Sig Start Date End Date Taking? Authorizing Provider  ibuprofen  (ADVIL ) 600 MG tablet Take 1 tablet (600 mg total) by mouth every 6 (six) hours as needed for mild pain or moderate pain. 04/22/23  Yes  [place], Darryle BRAVO, FNP  acetaminophen -codeine  (TYLENOL  #3) 300-30 MG tablet  12/28/16   [provider]  amoxicillin -clavulanate (AUGMENTIN ) 875-125 MG tablet Take 1 tablet by mouth every 12 (twelve) hours. Patient not taking: Reported on 06/04/2024 04/07/24   Teresa Shelba SAUNDERS, NP  benzonatate  (TESSALON ) 100 MG capsule Take 1 capsule (100 mg total) by mouth every 8 (eight) hours. Patient not taking: Reported on 06/04/2024 04/03/24   Teresa Shelba SAUNDERS, NP  guaiFENesin -codeine  100-10 MG/5ML syrup Take 5 mLs by mouth every 6 (six) hours as needed for cough. Patient not taking: Reported on 06/04/2024 04/03/24   Teresa Shelba SAUNDERS, NP  meloxicam (MOBIC) 15 MG tablet Take 15 mg by mouth. Patient not taking: Reported on 06/04/2024 10/04/16   [provider]  methocarbamol  (ROBAXIN ) 500 MG tablet Take 1 tablet (500 mg total) by mouth 2 (two) times daily as needed for muscle spasms. Patient not taking: Reported on 06/04/2024 04/22/23   Hazen Darryle BRAVO, FNP  metroNIDAZOLE  (FLAGYL ) 500 MG tablet Take 1 tablet (500 mg total) by mouth 2 (two) times daily. Patient not taking: Reported on 06/04/2024 08/12/23   [facility],  Haley E, FNP  norelgestromin-ethinyl estradiol (XULANE) 150-35 MCG/24HR transdermal patch  06/01/16   [provider]  topiramate  (TOPAMAX ) 25 MG tablet Take one tablet at night for one week, then take 2 tablets at night Patient not taking: Reported on 06/04/2024 06/22/16   [provider]    Family History Family History  Problem Relation Age of Onset  . Heart disease Mother   . Hypertension Mother   . Diabetes Mother     Social History Social History   Tobacco Use  . Smoking status: Some Days    Types: Cigars    Passive exposure: Current  . Smokeless tobacco: Never  Vaping Use  . Vaping status: Never Used  Substance Use Topics  . Alcohol use: No  . Drug use: No     Allergies   Patient has no known allergies.   Review of Systems Review of Systems  Constitutional:   Negative for chills and fever.  Eyes:  Negative for discharge, redness and visual disturbance.  Respiratory:  Negative for shortness of breath.   Cardiovascular:  Negative for chest pain.  Gastrointestinal:  Negative for abdominal pain, nausea and vomiting.  Neurological:  Positive for dizziness. Negative for numbness and headaches.     Physical Exam Triage Vital Signs ED Triage Vitals [06/04/24 1540]  Encounter Vitals Group     BP (!) 141/86     Girls Systolic BP Percentile      Girls Diastolic BP Percentile      Boys Systolic BP Percentile      Boys Diastolic BP Percentile      Pulse Rate 82     Resp 14     Temp 98 F (36.7 C)     Temp Source Oral     SpO2 98 %     Weight      Height      Head Circumference      Peak Flow      Pain Score 0     Pain Loc      Pain Education      Exclude from Growth Chart    No data found.  Updated Vital Signs BP (!) 141/86 (BP Location: Left Arm)   Pulse 82   Temp 98 F (36.7 C) (Oral)   Resp 14   SpO2 98%   Visual Acuity Right Eye Distance:   Left Eye Distance:   Bilateral Distance:    Right Eye Near:   Left Eye Near:    Bilateral Near:     Physical Exam Vitals and nursing note reviewed.  Constitutional:      General: She is not in acute distress.    Appearance: Normal appearance. She is not ill-appearing.  HENT:     Head: Normocephalic and atraumatic.  Eyes:     Conjunctiva/sclera: Conjunctivae normal.  Cardiovascular:     Rate and Rhythm: Normal rate.  Pulmonary:     Effort: Pulmonary effort is normal.  Neurological:     Mental Status: She is alert.  Psychiatric:        Mood and Affect: Mood normal.        Behavior: Behavior normal.        Thought Content: Thought content normal.      UC Treatments / Results  Labs (all labs ordered are listed, but only abnormal results are displayed) Labs Reviewed - No data to display  EKG   Radiology No results found.  Procedures Procedures (including critical  care time)  Medications Ordered in UC Medications - No data to display  Initial Impression / Assessment and Plan / UC Course  I have reviewed the triage vital signs and the nursing notes.  Pertinent labs & imaging results that were available during my care of the patient were reviewed by me and considered in my medical decision making (see chart for details).     *** Final Clinical Impressions(s) / UC Diagnoses   Final diagnoses:  None   Discharge Instructions   None    ED Prescriptions   None    PDMP not reviewed this encounter.

## 2024-06-04 NOTE — ED Triage Notes (Signed)
 Pt reports acute episode of vertigo that started in the last 2hrs when she went to stand up after bending over at work. Notes hx of vertigo flare ups, but it's been years since she had them regularly. Last episode was in June 2025 after staying on 42nd floor of a hotel. Pt notes constant dizziness like the room is spinning. Difficulty walking due to the dizziness. No med use for symptoms. Denies headache, palpitations, SOB, or chest pain.

## 2024-10-03 ENCOUNTER — Encounter: Payer: Self-pay | Admitting: *Deleted

## 2024-10-03 ENCOUNTER — Ambulatory Visit: Admission: EM | Admit: 2024-10-03 | Discharge: 2024-10-03 | Disposition: A | Discharge status: Home / Self Care

## 2024-10-03 DIAGNOSIS — B349 Viral infection, unspecified: Secondary | ICD-10-CM

## 2024-10-03 MED ORDER — PROMETHAZINE-DM 6.25-15 MG/5ML PO SYRP: 10.0000 mL | ORAL_SOLUTION | Freq: Three times a day (TID) | ORAL | 0 refills | Status: AC | PRN

## 2024-10-03 MED ORDER — PREDNISONE 20 MG PO TABS: 40.0000 mg | ORAL_TABLET | Freq: Every day | ORAL | 0 refills | Status: AC

## 2024-10-03 MED ORDER — AZELASTINE HCL 0.1 % NA SOLN: 1.0000 | Freq: Two times a day (BID) | NASAL | 0 refills | Status: AC

## 2024-10-03 MED ORDER — BENZONATATE 200 MG PO CAPS: 200.0000 mg | ORAL_CAPSULE | Freq: Three times a day (TID) | ORAL | 0 refills | Status: AC | PRN

## 2024-10-03 NOTE — ED Provider Notes (Signed)
 " [facility]  Note:  This document was prepared using Dragon voice recognition software and may include unintentional dictation errors.  MRN: [id number] DOB: 13-Jan-1976  Subjective:   Patricia Boone is a 49 y.o. female presenting for cough with production of phlegm, patient denies fever, severe sore throat, shortness of breath, chest pain.  Patient reports some mild wheezing usually when laying down and with severe coughing.  Patient continues to take Tessalon  Perles that she was previously prescribed in the emergency to help immune system.  Patient also states that her smell and taste are slightly off.  Denies any known sick contacts or exposure to COVID or flu.  Current Medications[1]   Allergies[2]  Past Medical History:  Diagnosis Date   Carpal tunnel syndrome on both sides    Has brace, has seen Dr. Beverley   Carpal tunnel syndrome, bilateral 07/05/2016   Common migraine with intractable migraine 06/22/2016   GERD (gastroesophageal reflux disease)    Prediabetes    Trigger finger    Vertigo      Past Surgical History:  Procedure Laterality Date   BREAST REDUCTION SURGERY Bilateral    Age 60   CARPAL TUNNEL RELEASE Right 11/23/2016   Procedure: RIGHT CARPAL TUNNEL RELEASE;  Surgeon: Arley Curia, MD;  Location: [facility];  Service: Orthopedics;  Laterality: Right;   TRIGGER FINGER RELEASE Right 11/23/2016   Procedure: RELEASE TRIGGER FINGER/A-1 PULLEY RIGHT RING FINGER;  Surgeon: Arley Curia, MD;  [facility];  Service: Orthopedics;  Laterality: Right;    Family History  Problem Relation Age of Onset   Heart disease Mother    Hypertension Mother    Diabetes Mother     Social History[3]  ROS Refer to HPI for ROS details.  Objective:   Vitals: BP (!) 152/82 (BP Location: Left Arm)   Pulse 95   Temp 98.5 F (36.9 C) (Oral)   Resp 18   LMP 09/26/2024 (Approximate)   SpO2 98%   Physical Exam Vitals and  nursing note reviewed.  Constitutional:      General: She is not in acute distress.    Appearance: Normal appearance. She is well-developed. She is not ill-appearing or toxic-appearing.  HENT:     Head: Normocephalic and atraumatic.     Nose: Congestion present. No rhinorrhea.     Mouth/Throat:     Mouth: Mucous membranes are moist.     Pharynx: Oropharynx is clear.  Cardiovascular:     Rate and Rhythm: Normal rate and regular rhythm.     Heart sounds: Normal heart sounds.  Pulmonary:     Effort: Pulmonary effort is normal. No respiratory distress.     Breath sounds: Normal breath sounds. No stridor. No wheezing, rhonchi or rales.  Chest:     Chest wall: No tenderness.  Skin:    General: Skin is warm and dry.  Neurological:     General: No focal deficit present.     Mental Status: She is alert and oriented to person, place, and time.  Psychiatric:        Mood and Affect: Mood normal.        Behavior: Behavior normal.     Procedures  No results found for this or any previous visit (from the past 24 hours).  No results found.   Assessment and Plan :     Discharge Instructions       1. Acute viral syndrome (Primary) - azelastine  (ASTELIN ) 0.1 %  nasal spray; Place 1 spray into both nostrils 2 (two) times daily. Use in each nostril as directed  Dispense: 30 mL; Refill: 0 - benzonatate  (TESSALON ) 200 MG capsule; Take 1 capsule (200 mg total) by mouth 3 (three) times daily as needed for cough.  Dispense: 20 capsule; Refill: 0 - promethazine -dextromethorphan (PROMETHAZINE -DM) 6.25-15 MG/5ML syrup; Take 10 mLs by mouth 3 (three) times daily as needed.  Dispense: 240 mL; Refill: 0 - predniSONE  (DELTASONE ) 20 MG tablet; Take 2 tablets (40 mg total) by mouth daily for 5 days.  Dispense: 10 tablet; Refill: 0 - No viral testing completed during this visit due to lack of testing supplies.  COVID and influenza tests are currently unavailable in urgent care.  -Continue to monitor  symptoms for any change in severity if there is any escalation of current symptoms or development of new symptoms follow-up in ER for further evaluation and management.      Sufyan Meidinger B Meshia Rau    [1] No current facility-administered medications for this encounter.  Current Outpatient Medications:    azelastine  (ASTELIN ) 0.1 % nasal spray, Place 1 spray into both nostrils 2 (two) times daily. Use in each nostril as directed, Disp: 30 mL, Rfl: 0   benzonatate  (TESSALON ) 200 MG capsule, Take 1 capsule (200 mg total) by mouth 3 (three) times daily as needed for cough., Disp: 20 capsule, Rfl: 0   predniSONE  (DELTASONE ) 20 MG tablet, Take 2 tablets (40 mg total) by mouth daily for 5 days., Disp: 10 tablet, Rfl: 0   promethazine -dextromethorphan (PROMETHAZINE -DM) 6.25-15 MG/5ML syrup, Take 10 mLs by mouth 3 (three) times daily as needed., Disp: 240 mL, Rfl: 0   acetaminophen -codeine  (TYLENOL  #3) 300-30 MG tablet, , Disp: , Rfl:    amoxicillin -clavulanate (AUGMENTIN ) 875-125 MG tablet, Take 1 tablet by mouth every 12 (twelve) hours. (Patient not taking: Reported on 06/04/2024), Disp: 14 tablet, Rfl: 0   guaiFENesin -codeine  100-10 MG/5ML syrup, Take 5 mLs by mouth every 6 (six) hours as needed for cough. (Patient not taking: Reported on 06/04/2024), Disp: 120 mL, Rfl: 0   ibuprofen  (ADVIL ) 600 MG tablet, Take 1 tablet (600 mg total) by mouth every 6 (six) hours as needed for mild pain or moderate pain. (Patient not taking: Reported on 10/03/2024), Disp: 30 tablet, Rfl: 0   meclizine  (ANTIVERT ) 25 MG tablet, Take 1 tablet (25 mg total) by mouth 3 (three) times daily as needed for dizziness. (Patient not taking: Reported on 10/03/2024), Disp: 30 tablet, Rfl: 0   meloxicam (MOBIC) 15 MG tablet, Take 15 mg by mouth. (Patient not taking: Reported on 06/04/2024), Disp: , Rfl:    methocarbamol  (ROBAXIN ) 500 MG tablet, Take 1 tablet (500 mg total) by mouth 2 (two) times daily as needed for muscle spasms. (Patient not  taking: Reported on 06/04/2024), Disp: 20 tablet, Rfl: 0   norelgestromin-ethinyl estradiol (XULANE) 150-35 MCG/24HR transdermal patch, , Disp: , Rfl:    ondansetron  (ZOFRAN -ODT) 4 MG disintegrating tablet, Take 1 tablet (4 mg total) by mouth every 8 (eight) hours as needed. (Patient not taking: Reported on 10/03/2024), Disp: 20 tablet, Rfl: 0   topiramate  (TOPAMAX ) 25 MG tablet, Take one tablet at night for one week, then take 2 tablets at night (Patient not taking: Reported on 06/04/2024), Disp: , Rfl:  [2] No Known Allergies [3]  Social History Tobacco Use   Smoking status: Some Days    Types: Cigars    Passive exposure: Current   Smokeless tobacco: Never  Vaping Use   Vaping  status: Never Used  Substance Use Topics   Alcohol use: No   Drug use: No     Aurea Ethel NOVAK, NP 10/03/24 9077  "

## 2024-10-03 NOTE — Discharge Instructions (Signed)
" °  1. Acute viral syndrome (Primary) - azelastine  (ASTELIN ) 0.1 % nasal spray; Place 1 spray into both nostrils 2 (two) times daily. Use in each nostril as directed  Dispense: 30 mL; Refill: 0 - benzonatate  (TESSALON ) 200 MG capsule; Take 1 capsule (200 mg total) by mouth 3 (three) times daily as needed for cough.  Dispense: 20 capsule; Refill: 0 - promethazine -dextromethorphan (PROMETHAZINE -DM) 6.25-15 MG/5ML syrup; Take 10 mLs by mouth 3 (three) times daily as needed.  Dispense: 240 mL; Refill: 0 - predniSONE  (DELTASONE ) 20 MG tablet; Take 2 tablets (40 mg total) by mouth daily for 5 days.  Dispense: 10 tablet; Refill: 0 - No viral testing completed during this visit due to lack of testing supplies.  COVID and influenza tests are currently unavailable in urgent care.  -Continue to monitor symptoms for any change in severity if there is any escalation of current symptoms or development of new symptoms follow-up in ER for further evaluation and management. "

## 2024-10-03 NOTE — ED Triage Notes (Signed)
 Pt reports cough since Sunday- Phlegm has a little color to it. No fever. Taking emergen-c and tessalon  from and old Rx (almost out). States sense of smell is off.
